# Patient Record
Sex: Male | Born: 1960 | Race: White | Hispanic: No | Marital: Married | Smoking: Never smoker
Health system: Southern US, Community
[De-identification: ages and names within clinical notes are randomized; demographics above are authoritative.]

## PROBLEM LIST (undated history)

## (undated) DIAGNOSIS — K5190 Ulcerative colitis, unspecified, without complications: Secondary | ICD-10-CM

## (undated) DIAGNOSIS — F32A Depression, unspecified: Secondary | ICD-10-CM

## (undated) DIAGNOSIS — E785 Hyperlipidemia, unspecified: Secondary | ICD-10-CM

## (undated) DIAGNOSIS — M1990 Unspecified osteoarthritis, unspecified site: Secondary | ICD-10-CM

## (undated) DIAGNOSIS — F329 Major depressive disorder, single episode, unspecified: Secondary | ICD-10-CM

## (undated) DIAGNOSIS — F1010 Alcohol abuse, uncomplicated: Secondary | ICD-10-CM

## (undated) DIAGNOSIS — M87052 Idiopathic aseptic necrosis of left femur: Secondary | ICD-10-CM

## (undated) DIAGNOSIS — K219 Gastro-esophageal reflux disease without esophagitis: Secondary | ICD-10-CM

## (undated) HISTORY — DX: Major depressive disorder, single episode, unspecified: F32.9

## (undated) HISTORY — DX: Hyperlipidemia, unspecified: E78.5

## (undated) HISTORY — PX: WISDOM TOOTH EXTRACTION: SHX21

## (undated) HISTORY — DX: Alcohol abuse, uncomplicated: F10.10

## (undated) HISTORY — PX: COLONOSCOPY: SHX174

## (undated) HISTORY — DX: Gastro-esophageal reflux disease without esophagitis: K21.9

## (undated) HISTORY — DX: Depression, unspecified: F32.A

---

## 2002-02-17 HISTORY — PX: HERNIA REPAIR: SHX51

## 2007-02-27 ENCOUNTER — Emergency Department (HOSPITAL_COMMUNITY): Admission: EM | Admit: 2007-02-27 | Discharge: 2007-02-27 | Payer: Self-pay | Admitting: Emergency Medicine

## 2009-03-28 LAB — HM COLONOSCOPY

## 2013-02-25 ENCOUNTER — Other Ambulatory Visit (HOSPITAL_COMMUNITY)
Admission: RE | Admit: 2013-02-25 | Discharge: 2013-02-25 | Disposition: A | Discharge status: Home / Self Care | Payer: BC Managed Care – PPO | Source: Ambulatory Visit | Attending: Gastroenterology | Admitting: Gastroenterology

## 2013-02-25 DIAGNOSIS — Z0389 Encounter for observation for other suspected diseases and conditions ruled out: Secondary | ICD-10-CM | POA: Insufficient documentation

## 2015-11-05 DIAGNOSIS — F329 Major depressive disorder, single episode, unspecified: Secondary | ICD-10-CM | POA: Insufficient documentation

## 2015-11-05 DIAGNOSIS — F32A Depression, unspecified: Secondary | ICD-10-CM | POA: Insufficient documentation

## 2015-11-05 DIAGNOSIS — F419 Anxiety disorder, unspecified: Secondary | ICD-10-CM | POA: Insufficient documentation

## 2016-06-02 DIAGNOSIS — M25552 Pain in left hip: Secondary | ICD-10-CM | POA: Insufficient documentation

## 2016-07-18 ENCOUNTER — Other Ambulatory Visit: Payer: Self-pay | Admitting: Orthopedic Surgery

## 2016-07-18 DIAGNOSIS — M25552 Pain in left hip: Secondary | ICD-10-CM

## 2016-07-30 ENCOUNTER — Ambulatory Visit
Admission: RE | Admit: 2016-07-30 | Discharge: 2016-07-30 | Disposition: A | Discharge status: Home / Self Care | Payer: BLUE CROSS/BLUE SHIELD | Source: Ambulatory Visit | Attending: Orthopedic Surgery | Admitting: Orthopedic Surgery

## 2016-07-30 DIAGNOSIS — M25552 Pain in left hip: Secondary | ICD-10-CM

## 2016-07-30 MED ORDER — IOPAMIDOL (ISOVUE-M 200) INJECTION 41%
15.0000 mL | Freq: Once | INTRAMUSCULAR | Status: AC
  Administered 2016-07-30: 15 mL via INTRA_ARTICULAR

## 2016-09-09 ENCOUNTER — Encounter (INDEPENDENT_AMBULATORY_CARE_PROVIDER_SITE_OTHER): Payer: Self-pay | Admitting: Orthopaedic Surgery

## 2016-09-09 ENCOUNTER — Ambulatory Visit (INDEPENDENT_AMBULATORY_CARE_PROVIDER_SITE_OTHER): Payer: BLUE CROSS/BLUE SHIELD | Admitting: Orthopaedic Surgery

## 2016-09-09 VITALS — BP 136/95 | HR 63 | Ht 67.0 in | Wt 180.0 lb

## 2016-09-09 DIAGNOSIS — M87051 Idiopathic aseptic necrosis of right femur: Secondary | ICD-10-CM | POA: Diagnosis not present

## 2016-09-09 DIAGNOSIS — M87052 Idiopathic aseptic necrosis of left femur: Secondary | ICD-10-CM

## 2016-09-22 DIAGNOSIS — M87052 Idiopathic aseptic necrosis of left femur: Principal | ICD-10-CM

## 2016-09-22 DIAGNOSIS — M87051 Idiopathic aseptic necrosis of right femur: Secondary | ICD-10-CM | POA: Insufficient documentation

## 2016-09-22 NOTE — Progress Notes (Signed)
Office Visit Note   Patient: Brett Fowler           Date of Birth: [birth year]           MRN: [id number] Visit Date: 09/09/2016              Requested by: Aleen Campi, NP [address] [place], [place] [address] PCP: Aleen Campi, NP   Assessment & Plan: Visit Diagnoses:  1. Avascular necrosis of bones of both hips ([place])     Plan: Patient has hip AVN significant involvement was secondary hip osteoarthritis. We discussed treatment options and treatment would require left total hip arthroplasty. We discussed direct anterior approach 2 night stay in the hospital use of crutches or walker after the surgery. He understands that he may later require hip arthroplasty on the opposite right hip or he might revascularize his right hip without significant collapse and not require total hip arthroplasty on the right side. We discussed the pathophysiology of the condition and given a copy of the report. We reviewed the MRI images as well as plain radiographs. He can call if he would like to schedule left total hip arthroplasty. Otherwise office follow-up in 4 months  Follow-Up Instructions: Return in about 4 months (around 01/10/2017).   Orders:  No orders of the defined types were placed in this encounter.  No orders of the defined types were placed in this encounter.     Procedures: No procedures performed   Clinical Data: No additional findings.   Subjective: Chief Complaint  Patient presents with  . Left Hip - Pain    HPI 56 year old male here with left greater than right hip pain. He was seen by his primary care physician and x-rays obtained was cemented [facility]. He had an injection in his hip the left leg gave way from just a few days pain when he first gets up. Pain when he flexes his hip past 90 he's taken ibuprofen intermittently and also is on menthol patches and Lexapro. He works as Wellsite geologist. Married his wife Butch Penny. Previous history of hernia  surgeries not been on any high-dose steroids. He denies fever chills.  Review of Systems positive for previous hernia surgery 2004, acid reflux history of bronchitis also depression. Objective: Vital Signs: BP (!) 136/95   Pulse 63   Ht 5' 7"  (1.702 m)   Wt 180 lb (81.6 kg)   BMI 28.19 kg/m   Physical Exam  Constitutional: He is oriented to person, place, and time. He appears well-developed and well-nourished.  HENT:  Head: Normocephalic and atraumatic.  Eyes: Pupils are equal, round, and reactive to light. EOM are normal.  Neck: No tracheal deviation present. No thyromegaly present.  Cardiovascular: Normal rate.   Pulmonary/Chest: Effort normal. He has no wheezes.  Abdominal: Soft. Bowel sounds are normal.  Musculoskeletal:  Patient ambulates with left greater than right Trendelenburg gait. No hip flexion contracture pain with flexion past 100. Patient has 20 internal rotation left hip with pain. No firm endpoint. External rotation 40 also with pain. 60 internal and external rotation opposite right hip with mild pain. Distal pulses are 2+ no rash or exposed skin negative popped of compression test. Patient has pain with straight leg raising which is in the groin related to his hip joint no setting not tenderness pelvis is leveldoses.  Neurological: He is alert and oriented to person, place, and time.  Skin: Skin is warm and dry. Capillary refill takes less  than 2 seconds.  Psychiatric: He has a normal mood and affect. His behavior is normal. Judgment and thought content normal.    Ortho Exam  Specialty Comments:  No specialty comments available.  Imaging: Study Result   CLINICAL DATA:  Left hip pain radiating to left knee x3 months without known injury. No surgery. Little relief with steroid injection.  EXAM: MRI OF THE LEFT HIP WITH CONTRAST  TECHNIQUE: Multiplanar, multisequence MR imaging was performed following the administration of intra-articular  contrast.  CONTRAST:  Please refer to the procedural report for contrast type and volume used.  COMPARISON:  None.  FINDINGS: Bones: Abnormal marrow edema involving the left femoral neck and base of femoral head associated with a 3.1 x 3 cm area of osteonecrosis involving the anterior weight-bearing portion of the femoral head. No collapse of the femoral head is identified. A similar abnormality involving the subchondral right femoral head is also noted measuring 2.1 cm transverse. Associated joint space narrowing of both hips with subchondral degenerative cystic change of both acetabular roofs.  Articular cartilage and labrum  Articular cartilage: Irregular thinning of the left femoral head and acetabular cartilage without focal chondral defect.  Labrum: Tear of the left superior and posterior acetabular labrum, series 2, image 9 through 14 and series 3 image 7 through 10.  Joint or bursal effusion  Joint effusion: Contrast distention of the left hip joint without intra-articular loose bodies.  Bursae:  No bursal fluid collections.  Muscles and tendons  Muscles and tendons:  Negative  Other findings  Miscellaneous: Included lumbar spine from L3 caudad demonstrates no significant disc desiccation. The sacroiliac joints maintained. No bone marrow abnormality of the sacrum or sacral ala.  IMPRESSION: 1. Marrow edema of the left femoral neck and base of femoral head associated with 3 cm zone of osteonecrosis. No flattening of the left femoral head. Similar subchondral zone of marrow signal abnormality involving the weight-bearing portion of the right femoral head without collapse. 2. Osteoarthritic joint space narrowing with chondral thinning of both hips with sub chondral cystic change also noted of the acetabular roofs. 3. Left acetabular labral tear involving the superior and posterior labrum.   Electronically Signed   By: Ashley Royalty M.D.    On: 07/30/2016       PMFS History: There are no active problems to display for this patient.  Past Medical History:  Diagnosis Date  . Acid reflux   . Depression     No family history on file.  Past Surgical History:  Procedure Laterality Date  . HERNIA REPAIR  2004   Social History   Occupational History  . shipping clerk    Social History Main Topics  . Smoking status: Never Smoker  . Smokeless tobacco: Never Used  . Alcohol use Yes     Comment: weekly  . Drug use: No  . Sexual activity: Not on file

## 2017-01-13 ENCOUNTER — Ambulatory Visit (INDEPENDENT_AMBULATORY_CARE_PROVIDER_SITE_OTHER): Payer: BLUE CROSS/BLUE SHIELD | Admitting: Orthopaedic Surgery

## 2017-01-20 ENCOUNTER — Encounter (INDEPENDENT_AMBULATORY_CARE_PROVIDER_SITE_OTHER): Payer: Self-pay | Admitting: Orthopaedic Surgery

## 2017-01-20 ENCOUNTER — Ambulatory Visit (INDEPENDENT_AMBULATORY_CARE_PROVIDER_SITE_OTHER): Payer: BLUE CROSS/BLUE SHIELD | Admitting: Orthopaedic Surgery

## 2017-01-20 VITALS — BP 137/84 | HR 55 | Ht 67.0 in | Wt 180.0 lb

## 2017-01-20 DIAGNOSIS — M1612 Unilateral primary osteoarthritis, left hip: Secondary | ICD-10-CM

## 2017-01-20 DIAGNOSIS — M87052 Idiopathic aseptic necrosis of left femur: Secondary | ICD-10-CM | POA: Diagnosis not present

## 2017-01-20 DIAGNOSIS — M87051 Idiopathic aseptic necrosis of right femur: Secondary | ICD-10-CM | POA: Diagnosis not present

## 2017-01-20 NOTE — Progress Notes (Signed)
Office Visit Note   Patient: Brett Fowler           Date of Birth: 08-Mar-1960           MRN: [id number] Visit Date: 01/20/2017              Requested by: Aleen Campi, NP [place], [place] [address] PCP: Aleen Campi, NP   Assessment & Plan: Visit Diagnoses:  1. Avascular necrosis of bones of both hips ([place])   2. Arthritis of left hip     Plan: We will proceed with left total hip arthroplasty after the holidays.  We discussed operative technique decision for surgery was made today wrist surgery discussed.  He may be an overnight stay patient since he has been active and working in opposite hip is not giving him any pain.  Procedure discussed he understands and requests we proceed.  Follow-Up Instructions: No Follow-up on file.   Orders:  No orders of the defined types were placed in this encounter.  No orders of the defined types were placed in this encounter.     Procedures: No procedures performed   Clinical Data: No additional findings.   Subjective: Chief Complaint  Patient presents with  . Right Hip - Pain, Follow-up  . Left Hip - Pain, Follow-up    HPI 56 year old male returns with ongoing pain with bilateral AVN with left hip pain and Trendelenburg gait.  He has pain with activities of daily living pain that bothers him at night and has had gradual progression of his hip pain with greater than 3 cm avascular area of the femoral head.  He has secondary osteoarthritis documented on MRI scan and has opposite right hip AVN but is not symptomatic.  Patient had previous cortisone injection in his hip use of a cane and several anti-inflammatories without control of his hip pain.  Pain at night and pain with activities of daily living.  He has an autistic son who lives with them age 60 and patient states she wants to put off hip surgery until after the holidays so he can arrange someone else to help his wife take care of his son with special  needs.  Review of Systems for history of bronchitis reflux, depression previous hernia surgery and AVN of the hips.   Objective: Vital Signs: BP 137/84   Pulse (!) 55   Ht 5' 7"  (1.702 m)   Wt 180 lb (81.6 kg)   BMI 28.19 kg/m   Physical Exam  Constitutional: He is oriented to person, place, and time. He appears well-developed and well-nourished.  HENT:  Head: Normocephalic and atraumatic.  Eyes: EOM are normal. Pupils are equal, round, and reactive to light.  Neck: No tracheal deviation present. No thyromegaly present.  Cardiovascular: Normal rate.  Pulmonary/Chest: Effort normal. He has no wheezes.  Abdominal: Soft. Bowel sounds are normal.  Neurological: He is alert and oriented to person, place, and time.  Skin: Skin is warm and dry. Capillary refill takes less than 2 seconds.  Psychiatric: He has a normal mood and affect. His behavior is normal. Judgment and thought content normal.    Ortho Exam positive Trendelenburg gait.  He has severe pain with left internal rotation of his hip and positive logroll test.  Negative Trendelenburg in the right hip distal pulses are 2+.  No sciatic notch tenderness.  Specialty Comments:  No specialty comments available.  Imaging: Previous MRI scan showed marrow edema 3 cm involvement  osteonecrosis with labral tearing anterior and posteriorly chondral thinning osteoarthritis and subchondral cyst formation in the acetabular roof.   PMFS History: Patient Active Problem List   Diagnosis Date Noted  . Avascular necrosis of bones of both hips ([place]) 09/22/2016   Past Medical History:  Diagnosis Date  . Acid reflux   . Depression     No family history on file.  Past Surgical History:  Procedure Laterality Date  . HERNIA REPAIR  2004   Social History   Occupational History  . Occupation: Wellsite geologist  Tobacco Use  . Smoking status: Never Smoker  . Smokeless tobacco: Never Used  Substance and Sexual Activity  . Alcohol use: Yes     Comment: weekly  . Drug use: No  . Sexual activity: Not on file

## 2017-06-03 LAB — HM COLONOSCOPY

## 2017-09-26 ENCOUNTER — Other Ambulatory Visit: Payer: Self-pay

## 2017-09-26 ENCOUNTER — Encounter (HOSPITAL_COMMUNITY): Payer: Self-pay

## 2017-09-26 ENCOUNTER — Emergency Department (HOSPITAL_COMMUNITY): Payer: BLUE CROSS/BLUE SHIELD

## 2017-09-26 ENCOUNTER — Emergency Department (HOSPITAL_COMMUNITY)
Admission: EM | Admit: 2017-09-26 | Discharge: 2017-09-26 | Disposition: A | Discharge status: Home / Self Care | Payer: BLUE CROSS/BLUE SHIELD | Attending: Emergency Medicine | Admitting: Emergency Medicine

## 2017-09-26 DIAGNOSIS — R319 Hematuria, unspecified: Secondary | ICD-10-CM | POA: Insufficient documentation

## 2017-09-26 DIAGNOSIS — M791 Myalgia, unspecified site: Secondary | ICD-10-CM | POA: Insufficient documentation

## 2017-09-26 DIAGNOSIS — R5381 Other malaise: Secondary | ICD-10-CM | POA: Insufficient documentation

## 2017-09-26 DIAGNOSIS — R630 Anorexia: Secondary | ICD-10-CM | POA: Insufficient documentation

## 2017-09-26 DIAGNOSIS — R531 Weakness: Secondary | ICD-10-CM | POA: Insufficient documentation

## 2017-09-26 DIAGNOSIS — L989 Disorder of the skin and subcutaneous tissue, unspecified: Secondary | ICD-10-CM | POA: Diagnosis not present

## 2017-09-26 DIAGNOSIS — Z79899 Other long term (current) drug therapy: Secondary | ICD-10-CM | POA: Diagnosis not present

## 2017-09-26 DIAGNOSIS — R6883 Chills (without fever): Secondary | ICD-10-CM | POA: Diagnosis not present

## 2017-09-26 DIAGNOSIS — R5383 Other fatigue: Secondary | ICD-10-CM | POA: Insufficient documentation

## 2017-09-26 DIAGNOSIS — R21 Rash and other nonspecific skin eruption: Secondary | ICD-10-CM | POA: Diagnosis not present

## 2017-09-26 DIAGNOSIS — R51 Headache: Secondary | ICD-10-CM | POA: Diagnosis not present

## 2017-09-26 HISTORY — DX: Idiopathic aseptic necrosis of left femur: M87.052

## 2017-09-26 LAB — CBC WITH DIFFERENTIAL/PLATELET
ABS IMMATURE GRANULOCYTES: 0 10*3/uL (ref 0.0–0.1)
BASOS ABS: 0 10*3/uL (ref 0.0–0.1)
Basophils Relative: 1 %
Eosinophils Absolute: 0 10*3/uL (ref 0.0–0.7)
Eosinophils Relative: 0 %
HCT: 42.1 % (ref 39.0–52.0)
HEMOGLOBIN: 14.2 g/dL (ref 13.0–17.0)
Immature Granulocytes: 1 %
LYMPHS PCT: 9 %
Lymphs Abs: 0.4 10*3/uL — ABNORMAL LOW (ref 0.7–4.0)
MCH: 35.2 pg — AB (ref 26.0–34.0)
MCHC: 33.7 g/dL (ref 30.0–36.0)
MCV: 104.5 fL — ABNORMAL HIGH (ref 78.0–100.0)
MONO ABS: 0.3 10*3/uL (ref 0.1–1.0)
Monocytes Relative: 6 %
NEUTROS ABS: 3.4 10*3/uL (ref 1.7–7.7)
NEUTROS PCT: 83 %
PLATELETS: 129 10*3/uL — AB (ref 150–400)
RBC: 4.03 MIL/uL — ABNORMAL LOW (ref 4.22–5.81)
RDW: 12.6 % (ref 11.5–15.5)
WBC: 4 10*3/uL (ref 4.0–10.5)

## 2017-09-26 LAB — COMPREHENSIVE METABOLIC PANEL
ALBUMIN: 3.5 g/dL (ref 3.5–5.0)
ALT: 42 U/L (ref 0–44)
ANION GAP: 8 (ref 5–15)
AST: 45 U/L — ABNORMAL HIGH (ref 15–41)
Alkaline Phosphatase: 45 U/L (ref 38–126)
BUN: 10 mg/dL (ref 6–20)
CO2: 26 mmol/L (ref 22–32)
Calcium: 8.8 mg/dL — ABNORMAL LOW (ref 8.9–10.3)
Chloride: 102 mmol/L (ref 98–111)
Creatinine, Ser: 0.84 mg/dL (ref 0.61–1.24)
GFR calc non Af Amer: >60 mL/min (ref >60)
GLUCOSE: 130 mg/dL — AB (ref 70–99)
POTASSIUM: 4 mmol/L (ref 3.5–5.1)
SODIUM: 136 mmol/L (ref 135–145)
TOTAL PROTEIN: 6.9 g/dL (ref 6.5–8.1)
Total Bilirubin: 0.8 mg/dL (ref 0.3–1.2)

## 2017-09-26 LAB — URINALYSIS, ROUTINE W REFLEX MICROSCOPIC
Bacteria, UA: NONE SEEN
Bilirubin Urine: NEGATIVE
GLUCOSE, UA: 150 mg/dL — AB
KETONES UR: NEGATIVE mg/dL
Leukocytes, UA: NEGATIVE
NITRITE: NEGATIVE
PH: 6 (ref 5.0–8.0)
PROTEIN: 100 mg/dL — AB
Specific Gravity, Urine: 1.027 (ref 1.005–1.030)

## 2017-09-26 LAB — I-STAT CG4 LACTIC ACID, ED: Lactic Acid, Venous: 1.43 mmol/L (ref 0.5–1.9)

## 2017-09-26 MED ORDER — DOXYCYCLINE HYCLATE 100 MG PO TABS
100.0000 mg | ORAL_TABLET | Freq: Once | ORAL | Status: AC
  Administered 2017-09-26: 100 mg via ORAL
  Filled 2017-09-26: qty 1

## 2017-09-26 MED ORDER — KETOROLAC TROMETHAMINE 30 MG/ML IJ SOLN
30.0000 mg | Freq: Once | INTRAMUSCULAR | Status: AC
  Administered 2017-09-26: 30 mg via INTRAVENOUS
  Filled 2017-09-26: qty 1

## 2017-09-26 MED ORDER — DOXYCYCLINE HYCLATE 100 MG PO CAPS: 100.0000 mg | ORAL_CAPSULE | Freq: Two times a day (BID) | ORAL | 0 refills | Status: DC

## 2017-09-26 NOTE — Discharge Instructions (Addendum)
Take doxycyline as prescribed until all gone if lymes titres come back positive. If negative, take for a week. If no improvement in symptoms, please follow up with your doctor or return here.

## 2017-09-26 NOTE — ED Provider Notes (Signed)
[facility] EMERGENCY DEPARTMENT Provider Note   CSN: [id number] Arrival date & time: 09/26/17  1208     History   Chief Complaint Chief Complaint  Patient presents with  . Rash  . Fatigue    HPI Brett Fowler is a 57 y.o. male.  HPI Brett Fowler is a 57 y.o. male to emergency department complaining of chills, fatigue, headache, generalized malaise, rash.  Patient states his symptoms mainly started 4 days ago.  He has been to his primary care doctor 2 days ago and had basic labs, thyroid function, urine analysis checked.  The only abnormal finding apparently according to the notes is that he had some blood in his urine.  Patient's symptoms are just not improving.  He did not check his temperature at home but states he has had sweats and chills.  He has had decreased appetite and generalized fatigue and malaise.  He has been taking Tylenol Motrin for his body aches and possible fever with no relief of his symptoms.  Today he woke up with a rash to his left thigh.  Denies any definite tick bites but states he is outdoors almost daily.  He denies any upper respiratory symptoms.  No cough or congestion.  No urinary symptoms.  No nausea or vomiting, no diarrhea no chest pain or abdominal pain.  The rash on his leg is nontender and not itchy.  Past Medical History:  Diagnosis Date  . Acid reflux   . Avascular necrosis of bone of hip, left ([place])   . Depression     Patient Active Problem List   Diagnosis Date Noted  . Avascular necrosis of bones of both hips ([place]) 09/22/2016    Past Surgical History:  Procedure Laterality Date  . HERNIA REPAIR  2004        Home Medications    Prior to Admission medications   Medication Sig Start Date End Date Taking? Authorizing Provider  escitalopram (LEXAPRO) 20 MG tablet TAKE 1 TABLET BY MOUTH EVERY DAY 08/12/16   [provider]  mercaptopurine (PURINETHOL) 50 MG tablet  08/12/16   [provider]    mesalamine (LIALDA) 1.2 g EC tablet  08/12/16   [provider]    Family History History reviewed. No pertinent family history.  Social History Social History   Tobacco Use  . Smoking status: Never Smoker  . Smokeless tobacco: Never Used  Substance Use Topics  . Alcohol use: Yes    Comment: weekly  . Drug use: No     Allergies   Patient has no known allergies.   Review of Systems Review of Systems  Constitutional: Positive for activity change, appetite change, chills and fatigue.  Respiratory: Negative for cough, chest tightness and shortness of breath.   Cardiovascular: Negative for chest pain, palpitations and leg swelling.  Gastrointestinal: Negative for abdominal distention, abdominal pain, diarrhea, nausea and vomiting.  Genitourinary: Negative for dysuria, frequency, hematuria and urgency.  Musculoskeletal: Positive for arthralgias and myalgias. Negative for neck pain and neck stiffness.  Skin: Positive for rash.  Allergic/Immunologic: Negative for immunocompromised state.  Neurological: Positive for weakness and headaches. Negative for dizziness, light-headedness and numbness.  All other systems reviewed and are negative.    Physical Exam Updated Vital Signs BP 108/81 (BP Location: Right Arm)   Pulse 79   Temp 97.9 F (36.6 C) (Oral)   Resp 17   SpO2 100%   Physical Exam  Constitutional: He appears well-developed and well-nourished.  No distress.  HENT:  Head: Normocephalic and atraumatic.  Mouth/Throat: Oropharynx is clear and moist.  Eyes: Conjunctivae are normal.  Neck: Neck supple.  No meningismus  Cardiovascular: Normal rate, regular rhythm and normal heart sounds.  Pulmonary/Chest: Effort normal. No respiratory distress. He has no wheezes. He has no rales.  Abdominal: Soft. Bowel sounds are normal. He exhibits no distension. There is no tenderness. There is no rebound.  Musculoskeletal: He exhibits no edema.  Neurological: He is alert.   Skin: Skin is warm and dry.  Target lesion to the left medial thigh, non tender, not indurated, not raised  Nursing note and vitals reviewed.      ED Treatments / Results  Labs (all labs ordered are listed, but only abnormal results are displayed) Labs Reviewed  COMPREHENSIVE METABOLIC PANEL - Abnormal; Notable for the following components:      Result Value   Glucose, Bld 130 (*)    Calcium 8.8 (*)    AST 45 (*)    All other components within normal limits  CBC WITH DIFFERENTIAL/PLATELET - Abnormal; Notable for the following components:   RBC 4.03 (*)    MCV 104.5 (*)    MCH 35.2 (*)    Platelets 129 (*)    Lymphs Abs 0.4 (*)    All other components within normal limits  URINALYSIS, ROUTINE W REFLEX MICROSCOPIC  LYME DISEASE DNA BY PCR([facility]IA [facility]UR[facility])  ROCKY MTN SPOTTED FVR ABS PNL(IGG+IGM)  I-STAT CG4 LACTIC ACID, ED    EKG None  Radiology No results found.  Procedures Procedures (including critical care time)  Medications Ordered in ED Medications  ketorolac (TORADOL) 30 MG/ML injection 30 mg (has no administration in time range)     Initial Impression / Assessment and Plan / ED Course  I have reviewed the triage vital signs and the nursing notes.  Pertinent labs & imaging results that were available during my care of the patient were reviewed by me and considered in my medical decision making (see chart for details).     Pt in ED with 4 days of generalized weakness, fatigue, malaise, headache. Today woke up with target lesion to left thigh consistent with erythema migrans. Will recheck labs and add tick born illness labs. VS normal.   2:50 PM Labs show just barely elevated AST and glucose, also hematuria otherwise unremarkable. His symptoms and rash concerning for possible lymes disease. PCR test sent. Will start on doxycyline. Otherwise pt is non toxic. Eating here in department with no difficulty. Will dc home with doxycycline and follow up with pcp as  well as follow up on lymes and RMSF. We discussed return precautions.   Vitals:   09/26/17 1400 09/26/17 1430  BP: 118/82 117/74  Pulse: 71 78  Resp: 16 16  Temp:    SpO2: 98% 98%     Final Clinical Impressions(s) / ED Diagnoses   Final diagnoses:  Weakness  Rash    ED Discharge Orders         Ordered    doxycycline (VIBRAMYCIN) 100 MG capsule  2 times daily     09/26/17 1516           Jeannett Senior, PA-C 09/26/17 1520    Lajean Saver, MD 09/26/17 1533

## 2017-09-26 NOTE — ED Triage Notes (Signed)
Onset 09-22-17 malaise, chills, aches, headache, decreased appetite, and diaphoresis.  Pt was seen at PCP 09-24-17, dx: viral illness.  Symptoms are not improving.  Onset this morning pt noticed a reddened area on left upper medial thigh, 3.5cm x 8cm.  Pt has not noticed this until today,

## 2017-09-26 NOTE — ED Notes (Signed)
Patient verbalizes understanding of discharge instructions. Opportunity for questioning and answers were provided. Armband removed by staff, pt discharged from ED.  

## 2017-09-29 LAB — LYME DISEASE DNA BY PCR(BORRELIA BURG): Lyme Disease(B.burgdorferi)PCR: NEGATIVE

## 2017-09-29 LAB — ROCKY MTN SPOTTED FVR ABS PNL(IGG+IGM)
RMSF IGG: NEGATIVE
RMSF IGM: 0.96 {index} — AB (ref 0.00–0.89)

## 2017-09-30 LAB — LYME, WESTERN BLOT, SERUM (REFLEXED)
IGG P18 AB.: ABSENT
IGG P28 AB.: ABSENT
IGG P30 AB.: ABSENT
IGG P39 AB.: ABSENT
IGG P41 AB.: ABSENT
IGG P45 AB.: ABSENT
IGG P93 AB.: ABSENT
IgG P23 Ab.: ABSENT
IgG P58 Ab.: ABSENT
IgG P66 Ab.: ABSENT
IgM P39 Ab.: ABSENT
IgM P41 Ab.: ABSENT
Lyme IgG Wb: NEGATIVE
Lyme IgM Wb: NEGATIVE

## 2017-09-30 LAB — B. BURGDORFI ANTIBODIES: B BURGDORFERI AB IGG+ IGM: 1.09 {ISR} — AB (ref 0.00–0.90)

## 2017-11-03 ENCOUNTER — Ambulatory Visit (INDEPENDENT_AMBULATORY_CARE_PROVIDER_SITE_OTHER): Payer: Self-pay

## 2017-11-03 ENCOUNTER — Ambulatory Visit (INDEPENDENT_AMBULATORY_CARE_PROVIDER_SITE_OTHER): Payer: BLUE CROSS/BLUE SHIELD | Admitting: Orthopaedic Surgery

## 2017-11-03 VITALS — BP 124/84 | HR 58 | Ht 67.0 in | Wt 180.0 lb

## 2017-11-03 DIAGNOSIS — M87052 Idiopathic aseptic necrosis of left femur: Secondary | ICD-10-CM | POA: Diagnosis not present

## 2017-11-03 DIAGNOSIS — M25552 Pain in left hip: Secondary | ICD-10-CM

## 2017-11-03 NOTE — Progress Notes (Signed)
Office Visit Note   Patient: Brett Fowler           Date of Birth: [birth year]           MRN: [id number] Visit Date: 11/03/2017              Requested by: Aleen Campi, NP [address] [place], [place] [address] PCP: Aleen Campi, NP   Assessment & Plan: Visit Diagnoses:  1. Pain in left hip   2. Avascular necrosis of hip, left ([facility])     Plan: Long-standing bilateral hip AVN with now left hip collapse.  He has no joint space has a 3 x 3 cm mobile fragment of the avascular piece that collapsed with secondary degenerative acetabular changes.  He is having great difficulty working anti-inflammatories are not effective .  He is having trouble working and taking care of his family.  Patient will require total hip arthroplasty.  Direct anterior approach discussed.  Postoperative rehab discussed risks of surgery discussed spinal anesthesia 1-2 night stay in the hospital use of a walker and then crutches postoperatively.  Questions were elicited and answered.  He understands only proceed with surgical intervention.  Follow-Up Instructions: No follow-ups on file.   Orders:  Orders Placed This Encounter  Procedures  . XR HIP UNILAT W OR W/O PELVIS 1V LEFT   No orders of the defined types were placed in this encounter.     Procedures: No procedures performed   Clinical Data: No additional findings.   Subjective: Chief Complaint  Patient presents with  . Left Hip - Pain    HPI 57 year old male returns with bilateral hip AVN with severe left hip pain.  He is amatory with a limp and have trouble doing his job has had to use a cane and is taken anti-inflammatories without relief and has problems by the end of the day with walking.  Previous MRI 07/30/2016 showed bilateral head AVN with left hip involvement of a 3.1 x 3 cm segment that had not collapsed last year.  X-rays obtained today shows severe collapse secondary hip osteoarthritis.  Opposite right hip shows no changes from  last year and he denies any right hip discomfort.  He has an autistic son who was large and muscular and sometimes combative and is having great difficulty taking care of his son who has special needs.   Review of Systems 14 point review of systems positive history of bronchitis, GI reflux, depression, previous hernia surgery.  Lateral hip AVN times several years.   Objective: Vital Signs: BP 124/84   Pulse (!) 58   Ht 5' 7"  (1.702 m)   Wt 180 lb (81.6 kg)   BMI 28.19 kg/m   Physical Exam  Constitutional: He is oriented to person, place, and time. He appears well-developed and well-nourished.  HENT:  Head: Normocephalic and atraumatic.  Eyes: Pupils are equal, round, and reactive to light. EOM are normal.  Neck: No tracheal deviation present. No thyromegaly present.  Cardiovascular: Normal rate.  Pulmonary/Chest: Effort normal. He has no wheezes.  Abdominal: Soft. Bowel sounds are normal.  Neurological: He is alert and oriented to person, place, and time.  Skin: Skin is warm and dry. Capillary refill takes less than 2 seconds.  Psychiatric: He has a normal mood and affect. His behavior is normal. Judgment and thought content normal.    Ortho Exam patient has intact pulses severe Trendelenburg gait.  5 degrees internal rotation left hip which reproduces pain.  Right  hip  Has  45 degrees internal/external rotation without discomfort.  External rotation of left hip is limited to only 20 degrees with severe pain.  Tailgate gait intact distal pulses negative straight leg raising no trochanteric bursal tenderness.  No synovitis of the upper extremities.  Sensation is foot is intact no plantar foot lesions.  Specialty Comments:  No specialty comments available.  Imaging: No results found.   PMFS History: Patient Active Problem List   Diagnosis Date Noted  . Avascular necrosis of bones of both hips ([place]) 09/22/2016   Past Medical History:  Diagnosis Date  . Acid reflux   .  Avascular necrosis of bone of hip, left ([place])   . Depression     No family history on file.  Past Surgical History:  Procedure Laterality Date  . HERNIA REPAIR  2004   Social History   Occupational History  . Occupation: Wellsite geologist  Tobacco Use  . Smoking status: Never Smoker  . Smokeless tobacco: Never Used  Substance and Sexual Activity  . Alcohol use: Yes    Comment: weekly  . Drug use: No  . Sexual activity: Not on file

## 2017-11-05 ENCOUNTER — Encounter (INDEPENDENT_AMBULATORY_CARE_PROVIDER_SITE_OTHER): Payer: Self-pay | Admitting: Orthopaedic Surgery

## 2018-02-24 ENCOUNTER — Ambulatory Visit (INDEPENDENT_AMBULATORY_CARE_PROVIDER_SITE_OTHER): Payer: BLUE CROSS/BLUE SHIELD | Admitting: Surgery

## 2018-03-24 ENCOUNTER — Inpatient Hospital Stay (INDEPENDENT_AMBULATORY_CARE_PROVIDER_SITE_OTHER): Payer: BLUE CROSS/BLUE SHIELD | Admitting: Orthopaedic Surgery

## 2018-04-14 ENCOUNTER — Encounter (INDEPENDENT_AMBULATORY_CARE_PROVIDER_SITE_OTHER): Payer: Self-pay | Admitting: Family

## 2018-04-14 ENCOUNTER — Ambulatory Visit (INDEPENDENT_AMBULATORY_CARE_PROVIDER_SITE_OTHER): Payer: BLUE CROSS/BLUE SHIELD | Admitting: Family

## 2018-04-14 ENCOUNTER — Ambulatory Visit (INDEPENDENT_AMBULATORY_CARE_PROVIDER_SITE_OTHER): Payer: BLUE CROSS/BLUE SHIELD | Admitting: Surgery

## 2018-04-14 VITALS — BP 135/85 | HR 57 | Temp 98.3°F | Ht 67.0 in | Wt 180.0 lb

## 2018-04-14 DIAGNOSIS — Z01818 Encounter for other preprocedural examination: Secondary | ICD-10-CM

## 2018-04-14 DIAGNOSIS — M87052 Idiopathic aseptic necrosis of left femur: Secondary | ICD-10-CM | POA: Diagnosis not present

## 2018-04-14 DIAGNOSIS — M87051 Idiopathic aseptic necrosis of right femur: Secondary | ICD-10-CM

## 2018-04-14 NOTE — H&P (Signed)
TOTAL HIP ADMISSION H&P  Patient is admitted for left total hip arthroplasty.  Subjective:  Chief Complaint: left hip pain  HPI: Brett Fowler, 58 y.o. male, has a history of pain and functional disability in the left hip(s) due to arthritis and AVN and patient has failed non-surgical conservative treatments for greater than 12 weeks to include NSAID's and/or analgesics, corticosteriod injections and use of assistive devices.  Onset of symptoms was abrupt starting 2 years ago with gradually worsening course since that time.The patient noted no past surgery on the left hip(s).  Patient currently rates pain in the left hip at 6 out of 10 with activity. Patient has worsening of pain with activity and weight bearing, pain that interfers with activities of daily living and pain with passive range of motion. 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  X-rays obtained shows severe collapse secondary hip osteoarthritis by imaging studies. This condition presents safety issues increasing the risk of falls. This patient has had avascular necrosis of the hip, acetabular fracture, hip dysplasia.  There is no current active infection.  Patient Active Problem List   Diagnosis Date Noted  . Avascular necrosis of bones of both hips ([place]) 09/22/2016   Past Medical History:  Diagnosis Date  . Acid reflux   . Avascular necrosis of bone of hip, left ([place])   . Depression     Past Surgical History:  Procedure Laterality Date  . HERNIA REPAIR  2004    No current facility-administered medications for this encounter.    Current Outpatient Medications  Medication Sig Dispense Refill Last Dose  . escitalopram (LEXAPRO) 20 MG tablet Take 20 mg by mouth every evening.    Taking  . Garlic 4166 MG CAPS Take 1,000 mg by mouth daily.    Taking  . GLUCOSAMINE-CHONDROIT-MSM-C-MN PO Take 1,500 mg by mouth 2 (two) times daily.     Marland Kitchen  ibuprofen (ADVIL,MOTRIN) 200 MG tablet Take 400 mg by mouth 2 (two) times daily as needed for fever or moderate pain.    Taking  . mercaptopurine (PURINETHOL) 50 MG tablet Take 50 mg by mouth daily.   10 Taking  . mesalamine (LIALDA) 1.2 g EC tablet Take 2.4 g by mouth daily with breakfast.   8 Taking  . Multiple Vitamin (MULTIVITAMIN) capsule Take 1 capsule by mouth daily.   Taking  . Turmeric 500 MG TABS Take 500 mg by mouth daily.     . vitamin C (ASCORBIC ACID) 500 MG tablet Take 500 mg by mouth daily.     Marland Kitchen doxycycline (VIBRAMYCIN) 100 MG capsule Take 1 capsule (100 mg total) by mouth 2 (two) times daily. (Patient not taking: Reported on 04/14/2018) 28 capsule 0 Not Taking   No Known Allergies  Social History   Tobacco Use  . Smoking status: Never Smoker  . Smokeless tobacco: Never Used  Substance Use Topics  . Alcohol use: Yes    Comment: weekly    No family history on file.   Review of Systems  Constitutional: Negative for chills and fever.  Musculoskeletal: Positive for joint pain. Negative for falls.  All other systems reviewed and are negative.   Objective:  Physical Exam  Vitals reviewed. Constitutional: He is oriented to person, place, and time. He appears well-developed and well-nourished.  HENT:  Head: Normocephalic and atraumatic.  Eyes: Pupils are equal, round, and reactive to light. Conjunctivae  and EOM are normal.  Neck: Neck supple.  Cardiovascular: Normal rate and regular rhythm.  Respiratory: Effort normal and breath sounds normal.  GI: Soft. There is no abdominal tenderness.  Musculoskeletal:        General: Tenderness present. No edema.  Neurological: He is alert and oriented to person, place, and time.  Skin: Skin is warm and dry.  Psychiatric: He has a normal mood and affect.    Vital signs in last 24 hours: Temp:  [98.3 F (36.8 C)] 98.3 F (36.8 C) (02/26 0945) Pulse Rate:  [57] 57 (02/26 0945) BP: (135)/(85) 135/85 (02/26 0945) Weight:   [81.6 kg] 81.6 kg (02/26 0945)  Labs:   Estimated body mass index is 28.19 kg/m as calculated from the following:   Height as of 04/14/18: 5' 7"  (1.702 m).   Weight as of 04/14/18: 81.6 kg.   Imaging Review Plain radiographs demonstrate severe degenerative joint disease of the left hip(s). The bone quality appears to be fair for age and reported activity level.      Assessment/Plan:  End stage arthritis, left hip(s)  The patient history, physical examination, clinical judgement of the provider and imaging studies are consistent with end stage degenerative joint disease of the left hip(s) and total hip arthroplasty is deemed medically necessary. The treatment options including medical management, injection therapy, arthroscopy and arthroplasty were discussed at length. The risks and benefits of total hip arthroplasty were presented and reviewed. The risks due to aseptic loosening, infection, stiffness, dislocation/subluxation,  thromboembolic complications and other imponderables were discussed.  The patient acknowledged the explanation, agreed to proceed with the plan and consent was signed. Patient is being admitted for inpatient treatment for surgery, pain control, PT, OT, prophylactic antibiotics, VTE prophylaxis, progressive ambulation and ADL's and discharge planning.The patient is planning to be discharged home with home health services   Anticipated LOS equal to or greater than 2 midnights due to - Age 37 and older with one or more of the following:  - Obesity  - Expected need for hospital services (PT, OT, Nursing) required for safe  discharge  - Anticipated need for postoperative skilled nursing care or inpatient rehab  - Active co-morbidities: None OR   - Unanticipated findings during/Post Surgery: None  - Patient is a high risk of re-admission due to: None

## 2018-04-14 NOTE — Progress Notes (Signed)
Visit for pre op history and physical. See chart for this.

## 2018-04-16 NOTE — Pre-Procedure Instructions (Signed)
Brett Fowler  04/16/2018    Your procedure is scheduled on Wednesday, April 28, 2018 at 12:30 PM.   Report to [facility] Entrance "A" Admitting Office at 10:30 AM.   Call this number if you have problems the morning of surgery: [phone]   Questions prior to day of surgery, please call [phone] between 8 & 4 PM.   Remember:  Do not eat or drink after midnight Tuesday, 04/27/18.  Take these medicines the morning of surgery with A SIP OF WATER: Mercaptopurine (Purinethol), Mesalamine (Lialda)  Stop Multivitamins, Herbal medications and NSAIDS (Ibuprofen, Aleve, etc) 7 days prior to surgery.    Do not wear jewelry.  Do not wear lotions, powders, cologne or deodorant.  Men may shave face and neck.  Do not bring valuables to the hospital.  [facility] is not responsible for any belongings or valuables.  Contacts, dentures or bridgework may not be worn into surgery.  Leave your suitcase in the car.  After surgery it may be brought to your room.  For patients admitted to the hospital, discharge time will be determined by your treatment team.  [facility] - Preparing for Surgery  Before surgery, you can play an important role.  Because skin is not sterile, your skin needs to be as free of germs as possible.  You can reduce the number of germs on you skin by washing with CHG (chlorahexidine gluconate) soap before surgery.  CHG is an antiseptic cleaner which kills germs and bonds with the skin to continue killing germs even after washing.  Oral Hygiene is also important in reducing the risk of infection.  Remember to brush your teeth with your regular toothpaste the morning of surgery.  Please DO NOT use if you have an allergy to CHG or antibacterial soaps.  If your skin becomes reddened/irritated stop using the CHG and inform your nurse when you arrive at Short Stay.  Do not shave (including legs and underarms) for at least 48 hours prior to the first CHG shower.  You may  shave your face.  Please follow these instructions carefully:   1.  Shower with CHG Soap the night before surgery and the morning of Surgery.  2.  If you choose to wash your hair, wash your hair first as usual with your normal shampoo.  3.  After you shampoo, rinse your hair and body thoroughly to remove the shampoo. 4.  Use CHG as you would any other liquid soap.  You can apply chg directly to the skin and wash gently with a      scrungie or washcloth.           5.  Apply the CHG Soap to your body ONLY FROM THE NECK DOWN.   Do not use on open wounds or open sores. Avoid contact with your eyes, ears, mouth and genitals (private parts).  Wash genitals (private parts) with your normal soap.  6.  Wash thoroughly, paying special attention to the area where your surgery will be performed.  7.  Thoroughly rinse your body with warm water from the neck down.  8.  DO NOT shower/wash with your normal soap after using and rinsing off the CHG Soap.  9.  Pat yourself dry with a clean towel.            10.  Wear clean pajamas.            11.  Place clean sheets on your bed the night  of your first shower and do not sleep with pets.  Day of Surgery  Shower as above. Do not apply any lotions/deodorants the morning of surgery.   Please wear clean clothes to the hospital. Remember to brush your teeth with toothpaste.   Please read over the fact sheets that you were given.

## 2018-04-19 ENCOUNTER — Encounter (HOSPITAL_COMMUNITY): Payer: Self-pay

## 2018-04-19 ENCOUNTER — Ambulatory Visit (HOSPITAL_COMMUNITY)
Admission: RE | Admit: 2018-04-19 | Discharge: 2018-04-19 | Disposition: A | Discharge status: Home / Self Care | Payer: BLUE CROSS/BLUE SHIELD | Source: Ambulatory Visit | Attending: Surgery | Admitting: Surgery

## 2018-04-19 ENCOUNTER — Other Ambulatory Visit: Payer: Self-pay

## 2018-04-19 ENCOUNTER — Telehealth (INDEPENDENT_AMBULATORY_CARE_PROVIDER_SITE_OTHER): Payer: Self-pay | Admitting: Orthopaedic Surgery

## 2018-04-19 ENCOUNTER — Encounter (HOSPITAL_COMMUNITY)
Admission: RE | Admit: 2018-04-19 | Discharge: 2018-04-19 | Disposition: A | Discharge status: Home / Self Care | Payer: BLUE CROSS/BLUE SHIELD | Source: Ambulatory Visit | Attending: Orthopaedic Surgery | Admitting: Orthopaedic Surgery

## 2018-04-19 DIAGNOSIS — Z01818 Encounter for other preprocedural examination: Secondary | ICD-10-CM | POA: Insufficient documentation

## 2018-04-19 HISTORY — DX: Ulcerative colitis, unspecified, without complications: K51.90

## 2018-04-19 HISTORY — DX: Unspecified osteoarthritis, unspecified site: M19.90

## 2018-04-19 LAB — CBC
HCT: 40.2 % (ref 39.0–52.0)
Hemoglobin: 14.6 g/dL (ref 13.0–17.0)
MCH: 37.6 pg — ABNORMAL HIGH (ref 26.0–34.0)
MCHC: 36.3 g/dL — ABNORMAL HIGH (ref 30.0–36.0)
MCV: 103.6 fL — ABNORMAL HIGH (ref 80.0–100.0)
NRBC: 1.9 % — AB (ref 0.0–0.2)
Platelets: 529 10*3/uL — ABNORMAL HIGH (ref 150–400)
RBC: 3.88 MIL/uL — ABNORMAL LOW (ref 4.22–5.81)
RDW: 12.9 % (ref 11.5–15.5)
WBC: 3.6 10*3/uL — ABNORMAL LOW (ref 4.0–10.5)

## 2018-04-19 LAB — SURGICAL PCR SCREEN
MRSA, PCR: NEGATIVE
Staphylococcus aureus: NEGATIVE

## 2018-04-19 LAB — URINALYSIS, ROUTINE W REFLEX MICROSCOPIC
Bilirubin Urine: NEGATIVE
Glucose, UA: NEGATIVE mg/dL
Hgb urine dipstick: NEGATIVE
Ketones, ur: NEGATIVE mg/dL
Leukocytes,Ua: NEGATIVE
Nitrite: NEGATIVE
PH: 8 (ref 5.0–8.0)
Protein, ur: NEGATIVE mg/dL
SPECIFIC GRAVITY, URINE: 1.015 (ref 1.005–1.030)

## 2018-04-19 NOTE — Telephone Encounter (Signed)
Clarene Critchley calling from short stay [phone].  Patient's preadmission appointment was today.  Blood sample was hemolyzed. Please advise if patient is to return to hospital to have done prior to surgery or day of.

## 2018-04-19 NOTE — Progress Notes (Addendum)
PCP - Dr. Helyn Numbers Cardiologist - states he saw one "years ago", can't remember the name  Chest x-ray - today EKG - toay Stress Test - denies ECHO - denies Cardiac Cath - denies  Sleep Study - denies CPAP -   Fasting Blood Sugar - n/a Checks Blood Sugar _____ times a day  Blood Thinner Instructions: n/a Aspirin Instructions: n/a  Anesthesia review: yes, EKG. Pt states he had "palpitations" years ago and saw a cardiologist (doesn't remember who it was) and it sounds like they had him wear a Holter monitor. He states the monitor didn't show anything. Have requested records from PCP in case they had results.  CMP hemolyzed - will draw DOS.  Patient denies shortness of breath, fever, cough and chest pain at PAT appointment   Patient verbalized understanding of instructions that were given to them at the PAT appointment. Patient was also instructed that they will need to review over the PAT instructions again at home before surgery.

## 2018-04-19 NOTE — Telephone Encounter (Signed)
Ok ffor them to do CMET when he show up at hospital day of surgery

## 2018-04-19 NOTE — Progress Notes (Signed)
Pt's CMET was hemolyzed, called and spoke with Debbie at Dr. Lorin Mercy' office earlier and she was to contact Dr. Lorin Mercy and ask if pt needed to have another drawn or let it be done day of surgery. Dr. Lorin Mercy called back and left message that pt could have it done day of surgery.

## 2018-04-19 NOTE — Telephone Encounter (Signed)
Please advise 

## 2018-04-19 NOTE — Telephone Encounter (Signed)
Can you relay message? I did not see call back number. Thanks.

## 2018-04-20 NOTE — Anesthesia Preprocedure Evaluation (Addendum)
Anesthesia Evaluation  Patient identified by MRN, date of birth, ID band Patient awake    Reviewed: Allergy & Precautions, H&P , NPO status , Patient's Chart, lab work & pertinent test results, reviewed documented beta blocker date and time   Airway Mallampati: I  TM Distance: >3 FB Neck ROM: full    Dental no notable dental hx. (+) Teeth Intact, Caps   Pulmonary neg pulmonary ROS,    Pulmonary exam normal breath sounds clear to auscultation       Cardiovascular Exercise Tolerance: Good negative cardio ROS   Rhythm:regular Rate:Normal     Neuro/Psych PSYCHIATRIC DISORDERS Depression negative neurological ROS     GI/Hepatic Neg liver ROS, GERD  Medicated,  Endo/Other  negative endocrine ROS  Renal/GU negative Renal ROS  negative genitourinary   Musculoskeletal  (+) Arthritis , Osteoarthritis,    Abdominal   Peds  Hematology negative hematology ROS (+)   Anesthesia Other Findings   Reproductive/Obstetrics negative OB ROS                             Anesthesia Physical Anesthesia Plan  ASA: II  Anesthesia Plan: Spinal   Post-op Pain Management:    Induction:   PONV Risk Score and Plan: 1 and Ondansetron and Treatment may vary due to age or medical condition  Airway Management Planned: Nasal Cannula  Additional Equipment:   Intra-op Plan:   Post-operative Plan:   Informed Consent: I have reviewed the patients History and Physical, chart, labs and discussed the procedure including the risks, benefits and alternatives for the proposed anesthesia with the patient or authorized representative who has indicated his/her understanding and acceptance.     Dental Advisory Given  Plan Discussed with: CRNA, Anesthesiologist and Surgeon  Anesthesia Plan Comments: (Pt reported hx of palpitations many years ago, said he had an evaluation that was benign. PCP office does not have any  cardiac studies on file. EKG 04/19/18 shows sinus rhythm with one PVC. Denies any current CV symptoms. )       Anesthesia Quick Evaluation

## 2018-04-22 ENCOUNTER — Telehealth (INDEPENDENT_AMBULATORY_CARE_PROVIDER_SITE_OTHER): Payer: Self-pay

## 2018-04-22 NOTE — Telephone Encounter (Signed)
I received a voicemail from [employer] stating she has been trying to reach pt by phone to go over some preop and post op info and she can't reach him and wanted to know if we had an additional # for him. # they have is [phone]. She did not leave her phone # though for me to be able to call her back.

## 2018-04-28 ENCOUNTER — Inpatient Hospital Stay (HOSPITAL_COMMUNITY)
Admission: RE | Admit: 2018-04-28 | Discharge: 2018-04-30 | DRG: 470 | Disposition: A | Discharge status: Home / Self Care | Payer: BLUE CROSS/BLUE SHIELD | Attending: Orthopaedic Surgery | Admitting: Orthopaedic Surgery

## 2018-04-28 ENCOUNTER — Inpatient Hospital Stay (HOSPITAL_COMMUNITY): Payer: BLUE CROSS/BLUE SHIELD

## 2018-04-28 ENCOUNTER — Encounter (HOSPITAL_COMMUNITY): Payer: Self-pay | Admitting: *Deleted

## 2018-04-28 ENCOUNTER — Other Ambulatory Visit: Payer: Self-pay

## 2018-04-28 ENCOUNTER — Inpatient Hospital Stay (HOSPITAL_COMMUNITY): Payer: BLUE CROSS/BLUE SHIELD | Admitting: Anesthesiology

## 2018-04-28 ENCOUNTER — Encounter (HOSPITAL_COMMUNITY)
Admission: RE | Disposition: A | Discharge status: Home / Self Care | Payer: Self-pay | Source: Home / Self Care | Attending: Orthopaedic Surgery

## 2018-04-28 ENCOUNTER — Inpatient Hospital Stay (HOSPITAL_COMMUNITY): Payer: BLUE CROSS/BLUE SHIELD | Admitting: Physician Assistant

## 2018-04-28 DIAGNOSIS — Z79899 Other long term (current) drug therapy: Secondary | ICD-10-CM

## 2018-04-28 DIAGNOSIS — M8788 Other osteonecrosis, other site: Secondary | ICD-10-CM | POA: Diagnosis present

## 2018-04-28 DIAGNOSIS — K219 Gastro-esophageal reflux disease without esophagitis: Secondary | ICD-10-CM | POA: Diagnosis present

## 2018-04-28 DIAGNOSIS — M87051 Idiopathic aseptic necrosis of right femur: Secondary | ICD-10-CM | POA: Diagnosis present

## 2018-04-28 DIAGNOSIS — M1612 Unilateral primary osteoarthritis, left hip: Secondary | ICD-10-CM | POA: Diagnosis present

## 2018-04-28 DIAGNOSIS — Z791 Long term (current) use of non-steroidal anti-inflammatories (NSAID): Secondary | ICD-10-CM | POA: Diagnosis not present

## 2018-04-28 DIAGNOSIS — M87052 Idiopathic aseptic necrosis of left femur: Secondary | ICD-10-CM | POA: Diagnosis not present

## 2018-04-28 DIAGNOSIS — Z419 Encounter for procedure for purposes other than remedying health state, unspecified: Secondary | ICD-10-CM

## 2018-04-28 DIAGNOSIS — F329 Major depressive disorder, single episode, unspecified: Secondary | ICD-10-CM | POA: Diagnosis present

## 2018-04-28 DIAGNOSIS — Z09 Encounter for follow-up examination after completed treatment for conditions other than malignant neoplasm: Secondary | ICD-10-CM

## 2018-04-28 HISTORY — PX: TOTAL HIP ARTHROPLASTY: SHX124

## 2018-04-28 LAB — COMPREHENSIVE METABOLIC PANEL
ALK PHOS: 51 U/L (ref 38–126)
ALT: 23 U/L (ref 0–44)
AST: 27 U/L (ref 15–41)
Albumin: 4.2 g/dL (ref 3.5–5.0)
Anion gap: 8 (ref 5–15)
BUN: 14 mg/dL (ref 6–20)
CO2: 24 mmol/L (ref 22–32)
Calcium: 9.3 mg/dL (ref 8.9–10.3)
Chloride: 107 mmol/L (ref 98–111)
Creatinine, Ser: 0.7 mg/dL (ref 0.61–1.24)
GFR calc Af Amer: >60 mL/min (ref >60)
GFR calc non Af Amer: >60 mL/min (ref >60)
Glucose, Bld: 106 mg/dL — ABNORMAL HIGH (ref 70–99)
Potassium: 4 mmol/L (ref 3.5–5.1)
Sodium: 139 mmol/L (ref 135–145)
Total Bilirubin: 1 mg/dL (ref 0.3–1.2)
Total Protein: 6.9 g/dL (ref 6.5–8.1)

## 2018-04-28 SURGERY — ARTHROPLASTY, HIP, TOTAL, ANTERIOR APPROACH
Anesthesia: Spinal | Site: Hip | Laterality: Left

## 2018-04-28 MED ORDER — LIDOCAINE HCL (CARDIAC) PF 100 MG/5ML IV SOSY
PREFILLED_SYRINGE | INTRAVENOUS | Status: DC | PRN
  Administered 2018-04-28: 100 mg via INTRAVENOUS

## 2018-04-28 MED ORDER — CHLORHEXIDINE GLUCONATE 4 % EX LIQD: 60.0000 mL | Freq: Once | CUTANEOUS | Status: DC

## 2018-04-28 MED ORDER — PROPOFOL 10 MG/ML IV BOLUS
INTRAVENOUS | Status: DC | PRN
  Administered 2018-04-28 (×3): 30 mg via INTRAVENOUS

## 2018-04-28 MED ORDER — MEPERIDINE HCL 50 MG/ML IJ SOLN: 6.2500 mg | INTRAMUSCULAR | Status: DC | PRN

## 2018-04-28 MED ORDER — FENTANYL CITRATE (PF) 250 MCG/5ML IJ SOLN
INTRAMUSCULAR | Status: AC
  Filled 2018-04-28: qty 5

## 2018-04-28 MED ORDER — 0.9 % SODIUM CHLORIDE (POUR BTL) OPTIME
TOPICAL | Status: DC | PRN
  Administered 2018-04-28: 1000 mL

## 2018-04-28 MED ORDER — DOCUSATE SODIUM 100 MG PO CAPS
100.0000 mg | ORAL_CAPSULE | Freq: Two times a day (BID) | ORAL | Status: DC
  Administered 2018-04-28 – 2018-04-30 (×4): 100 mg via ORAL
  Filled 2018-04-28 (×4): qty 1

## 2018-04-28 MED ORDER — EPHEDRINE SULFATE 50 MG/ML IJ SOLN
INTRAMUSCULAR | Status: DC | PRN
  Administered 2018-04-28: 10 mg via INTRAVENOUS

## 2018-04-28 MED ORDER — BUPIVACAINE-EPINEPHRINE 0.25% -1:200000 IJ SOLN
INTRAMUSCULAR | Status: DC | PRN
  Administered 2018-04-28: 10 mL

## 2018-04-28 MED ORDER — SODIUM CHLORIDE 0.9 % IV SOLN
INTRAVENOUS | Status: DC
  Administered 2018-04-28: 19:00:00 via INTRAVENOUS

## 2018-04-28 MED ORDER — OXYCODONE HCL 5 MG/5ML PO SOLN: 5.0000 mg | Freq: Once | ORAL | Status: DC | PRN

## 2018-04-28 MED ORDER — MENTHOL 3 MG MT LOZG: 1.0000 | LOZENGE | OROMUCOSAL | Status: DC | PRN

## 2018-04-28 MED ORDER — PHENYLEPHRINE HCL 10 MG/ML IJ SOLN
INTRAMUSCULAR | Status: DC | PRN
  Administered 2018-04-28 (×3): 80 ug via INTRAVENOUS

## 2018-04-28 MED ORDER — OXYCODONE HCL 5 MG PO TABS: 5.0000 mg | ORAL_TABLET | Freq: Once | ORAL | Status: DC | PRN

## 2018-04-28 MED ORDER — CEFAZOLIN SODIUM-DEXTROSE 2-4 GM/100ML-% IV SOLN
2.0000 g | INTRAVENOUS | Status: AC
  Administered 2018-04-28: 2 g via INTRAVENOUS

## 2018-04-28 MED ORDER — METHOCARBAMOL 1000 MG/10ML IJ SOLN
500.0000 mg | Freq: Four times a day (QID) | INTRAVENOUS | Status: DC | PRN
  Filled 2018-04-28: qty 5

## 2018-04-28 MED ORDER — MIDAZOLAM HCL 2 MG/2ML IJ SOLN
INTRAMUSCULAR | Status: AC
  Filled 2018-04-28: qty 2

## 2018-04-28 MED ORDER — ASPIRIN EC 325 MG PO TBEC
325.0000 mg | DELAYED_RELEASE_TABLET | Freq: Every day | ORAL | Status: DC
  Administered 2018-04-29 – 2018-04-30 (×2): 325 mg via ORAL
  Filled 2018-04-28 (×2): qty 1

## 2018-04-28 MED ORDER — PROPOFOL 10 MG/ML IV BOLUS
INTRAVENOUS | Status: AC
  Filled 2018-04-28: qty 20

## 2018-04-28 MED ORDER — CEFAZOLIN SODIUM-DEXTROSE 1-4 GM/50ML-% IV SOLN
1.0000 g | Freq: Three times a day (TID) | INTRAVENOUS | Status: AC
  Administered 2018-04-28 – 2018-04-29 (×2): 1 g via INTRAVENOUS
  Filled 2018-04-28 (×2): qty 50

## 2018-04-28 MED ORDER — OXYCODONE HCL 5 MG PO TABS
5.0000 mg | ORAL_TABLET | ORAL | Status: DC | PRN
  Administered 2018-04-28 – 2018-04-29 (×4): 10 mg via ORAL
  Filled 2018-04-28 (×4): qty 2

## 2018-04-28 MED ORDER — ONDANSETRON HCL 4 MG PO TABS: 4.0000 mg | ORAL_TABLET | Freq: Four times a day (QID) | ORAL | Status: DC | PRN

## 2018-04-28 MED ORDER — METOCLOPRAMIDE HCL 5 MG PO TABS: 5.0000 mg | ORAL_TABLET | Freq: Three times a day (TID) | ORAL | Status: DC | PRN

## 2018-04-28 MED ORDER — MIDAZOLAM HCL 2 MG/2ML IJ SOLN
INTRAMUSCULAR | Status: DC | PRN
  Administered 2018-04-28: 2 mg via INTRAVENOUS

## 2018-04-28 MED ORDER — ACETAMINOPHEN 325 MG PO TABS: 325.0000 mg | ORAL_TABLET | Freq: Four times a day (QID) | ORAL | Status: DC | PRN

## 2018-04-28 MED ORDER — PROPOFOL 500 MG/50ML IV EMUL
INTRAVENOUS | Status: DC | PRN
  Administered 2018-04-28: 75 ug/kg/min via INTRAVENOUS
  Administered 2018-04-28: 100 ug/kg/min via INTRAVENOUS

## 2018-04-28 MED ORDER — ACETAMINOPHEN 160 MG/5ML PO SOLN: 325.0000 mg | ORAL | Status: DC | PRN

## 2018-04-28 MED ORDER — METOCLOPRAMIDE HCL 5 MG/ML IJ SOLN: 5.0000 mg | Freq: Three times a day (TID) | INTRAMUSCULAR | Status: DC | PRN

## 2018-04-28 MED ORDER — PHENOL 1.4 % MT LIQD: 1.0000 | OROMUCOSAL | Status: DC | PRN

## 2018-04-28 MED ORDER — BUPIVACAINE-EPINEPHRINE (PF) 0.25% -1:200000 IJ SOLN
INTRAMUSCULAR | Status: AC
  Filled 2018-04-28: qty 30

## 2018-04-28 MED ORDER — METHOCARBAMOL 500 MG PO TABS
500.0000 mg | ORAL_TABLET | Freq: Four times a day (QID) | ORAL | Status: DC | PRN
  Administered 2018-04-28 – 2018-04-29 (×3): 500 mg via ORAL
  Filled 2018-04-28 (×3): qty 1

## 2018-04-28 MED ORDER — ONDANSETRON HCL 4 MG/2ML IJ SOLN: 4.0000 mg | Freq: Once | INTRAMUSCULAR | Status: DC | PRN

## 2018-04-28 MED ORDER — CEFAZOLIN SODIUM-DEXTROSE 2-4 GM/100ML-% IV SOLN
INTRAVENOUS | Status: AC
  Filled 2018-04-28: qty 100

## 2018-04-28 MED ORDER — FENTANYL CITRATE (PF) 100 MCG/2ML IJ SOLN: 25.0000 ug | INTRAMUSCULAR | Status: DC | PRN

## 2018-04-28 MED ORDER — ALBUMIN HUMAN 5 % IV SOLN
INTRAVENOUS | Status: DC | PRN
  Administered 2018-04-28: 15:00:00 via INTRAVENOUS

## 2018-04-28 MED ORDER — POLYETHYLENE GLYCOL 3350 17 G PO PACK: 17.0000 g | PACK | Freq: Every day | ORAL | Status: DC | PRN

## 2018-04-28 MED ORDER — LACTATED RINGERS IV SOLN
INTRAVENOUS | Status: DC
  Administered 2018-04-28 (×2): via INTRAVENOUS

## 2018-04-28 MED ORDER — BUPIVACAINE LIPOSOME 1.3 % IJ SUSP
20.0000 mL | INTRAMUSCULAR | Status: AC
  Administered 2018-04-28: 10 mL
  Filled 2018-04-28: qty 20

## 2018-04-28 MED ORDER — ONDANSETRON HCL 4 MG/2ML IJ SOLN: 4.0000 mg | Freq: Four times a day (QID) | INTRAMUSCULAR | Status: DC | PRN

## 2018-04-28 MED ORDER — HYDROMORPHONE HCL 1 MG/ML IJ SOLN
0.5000 mg | INTRAMUSCULAR | Status: DC | PRN
  Administered 2018-04-29: 1 mg via INTRAVENOUS
  Filled 2018-04-28: qty 1

## 2018-04-28 MED ORDER — ACETAMINOPHEN 325 MG PO TABS: 325.0000 mg | ORAL_TABLET | ORAL | Status: DC | PRN

## 2018-04-28 SURGICAL SUPPLY — 55 items
BENZOIN TINCTURE PRP APPL 2/3 (GAUZE/BANDAGES/DRESSINGS) ×3 IMPLANT
BLADE CLIPPER SURG (BLADE) IMPLANT
BLADE SAW SGTL 18X1.27X75 (BLADE) ×2 IMPLANT
BLADE SAW SGTL 18X1.27X75MM (BLADE) ×1
CELLS DAT CNTRL 66122 CELL SVR (MISCELLANEOUS) ×1 IMPLANT
CLOSURE WOUND 1/2 X4 (GAUZE/BANDAGES/DRESSINGS) ×1
COVER SURGICAL LIGHT HANDLE (MISCELLANEOUS) ×3 IMPLANT
COVER WAND RF STERILE (DRAPES) ×3 IMPLANT
CUP PINN GRIPTON 54 100 (Cup) ×2 IMPLANT
DRAPE C-ARM 42X72 X-RAY (DRAPES) ×3 IMPLANT
DRAPE IMP U-DRAPE 54X76 (DRAPES) ×3 IMPLANT
DRAPE INCISE IOBAN 66X45 STRL (DRAPES) ×2 IMPLANT
DRAPE STERI IOBAN 125X83 (DRAPES) ×3 IMPLANT
DRAPE U-SHAPE 47X51 STRL (DRAPES) ×9 IMPLANT
DRSG MEPILEX BORDER 4X8 (GAUZE/BANDAGES/DRESSINGS) ×3 IMPLANT
DURAPREP 26ML APPLICATOR (WOUND CARE) ×3 IMPLANT
ELECT BLADE 4.0 EZ CLEAN MEGAD (MISCELLANEOUS)
ELECT CAUTERY BLADE 6.4 (BLADE) ×3 IMPLANT
ELECT REM PT RETURN 9FT ADLT (ELECTROSURGICAL) ×3
ELECTRODE BLDE 4.0 EZ CLN MEGD (MISCELLANEOUS) IMPLANT
ELECTRODE REM PT RTRN 9FT ADLT (ELECTROSURGICAL) ×1 IMPLANT
ELIMINATOR HOLE APEX DEPUY (Hips) ×2 IMPLANT
FACESHIELD WRAPAROUND (MASK) ×6 IMPLANT
FACESHIELD WRAPAROUND OR TEAM (MASK) ×2 IMPLANT
GLOVE BIOGEL PI IND STRL 8 (GLOVE) ×2 IMPLANT
GLOVE BIOGEL PI INDICATOR 8 (GLOVE) ×4
GLOVE ORTHO TXT STRL SZ7.5 (GLOVE) ×6 IMPLANT
GOWN STRL REUS W/ TWL LRG LVL3 (GOWN DISPOSABLE) ×1 IMPLANT
GOWN STRL REUS W/ TWL XL LVL3 (GOWN DISPOSABLE) ×1 IMPLANT
GOWN STRL REUS W/TWL 2XL LVL3 (GOWN DISPOSABLE) ×3 IMPLANT
GOWN STRL REUS W/TWL LRG LVL3 (GOWN DISPOSABLE) ×2
GOWN STRL REUS W/TWL XL LVL3 (GOWN DISPOSABLE) ×2
HEAD CERAMIC 36 PLUS5 (Hips) ×2 IMPLANT
KIT BASIN OR (CUSTOM PROCEDURE TRAY) ×3 IMPLANT
KIT TURNOVER KIT B (KITS) ×3 IMPLANT
LINER NEUTRAL 54X36MM PLUS 4 (Hips) ×2 IMPLANT
MANIFOLD NEPTUNE II (INSTRUMENTS) ×3 IMPLANT
NS IRRIG 1000ML POUR BTL (IV SOLUTION) ×3 IMPLANT
PACK TOTAL JOINT (CUSTOM PROCEDURE TRAY) ×3 IMPLANT
PAD ARMBOARD 7.5X6 YLW CONV (MISCELLANEOUS) ×6 IMPLANT
RETRACTOR WND ALEXIS 18 MED (MISCELLANEOUS) ×1 IMPLANT
RTRCTR WOUND ALEXIS 18CM MED (MISCELLANEOUS) ×3
STEM FEM ACTIS STD SZ4 (Stem) ×2 IMPLANT
STRIP CLOSURE SKIN 1/2X4 (GAUZE/BANDAGES/DRESSINGS) ×2 IMPLANT
SUT VIC AB 0 CT1 27 (SUTURE) ×2
SUT VIC AB 0 CT1 27XBRD ANBCTR (SUTURE) ×1 IMPLANT
SUT VIC AB 2-0 CT1 27 (SUTURE) ×2
SUT VIC AB 2-0 CT1 TAPERPNT 27 (SUTURE) ×1 IMPLANT
SUT VICRYL 4-0 PS2 18IN ABS (SUTURE) ×3 IMPLANT
SUT VLOC 180 0 24IN GS25 (SUTURE) ×3 IMPLANT
TOWEL OR 17X24 6PK STRL BLUE (TOWEL DISPOSABLE) ×3 IMPLANT
TOWEL OR 17X26 10 PK STRL BLUE (TOWEL DISPOSABLE) ×6 IMPLANT
TRAY CATH 16FR W/PLASTIC CATH (SET/KITS/TRAYS/PACK) IMPLANT
TRAY FOLEY MTR SLVR 16FR STAT (SET/KITS/TRAYS/PACK) IMPLANT
WATER STERILE IRR 1000ML POUR (IV SOLUTION) ×6 IMPLANT

## 2018-04-28 NOTE — Transfer of Care (Signed)
Immediate Anesthesia Transfer of Care Note  Patient: Brett Fowler  Procedure(s) Performed: LEFT TOTAL HIP ARTHROPLASTY DIRECT ANTERIOR (Left Hip)  Patient Location: PACU  Anesthesia Type:MAC and Spinal  Level of Consciousness: awake, alert  and oriented  Airway & Oxygen Therapy: Patient Spontanous Breathing and Patient connected to face mask oxygen  Post-op Assessment: Report given to RN and Post -op Vital signs reviewed and stable  Post vital signs: Reviewed and stable  Last Vitals:  Vitals Value Taken Time  BP 115/74 04/28/2018  3:58 PM  Temp    Pulse 59 04/28/2018  3:59 PM  Resp 9 04/28/2018  3:59 PM  SpO2 98 % 04/28/2018  3:59 PM  Vitals shown include unvalidated device data.  Last Pain:  Vitals:   04/28/18 1050  TempSrc: Oral  PainSc: 0-No pain      Patients Stated Pain Goal: 0 (56/31/49 7026)  Complications: No apparent anesthesia complications

## 2018-04-28 NOTE — Op Note (Signed)
Preop diagnosis: Left hip avascular necrosis with secondary osteoarthritis  Postop diagnosis: Same  Procedure: Left total hip arthroplasty direct anterior approach.  Anesthesia: General plus Marcaine and Exparel.  Surgeon: Rodell Perna, MD  Assistant: Benjiman Core, PA-C medically necessary and present for the entire procedure  EBL see anesthetic record  Implants: #4 standard size 4 Actis stem Depuy, 36+5 ceramic head.  5436 neutral liner +4.  Grip shin 54 mm series 100 cup apex hole eliminator.  Procedure after induction of spinal anesthesia space is placed in the Hana boot.  Careful positioning C arm was brought in with good visitation of the hip.  To 15 drapes were applied above the iliac crest and now by the knee hip was prepped with ChloraPrep and the usual split sticky sheets drapes large shower curtain Betadine Steri-Drape was applied after sterile skin marker was used for direct anterior approach.  Timeout procedure was completed Ancef prophylactically.  Incision was made starting 2 cm inferior 1 cm lateral to the ASIS passing obliquely down to the trochanter.  Subcutaneous tissue was elevated skin retractor placed.  Fascia split in line with the skin incision elevated medially and then transverse vessels were coagulated fat was removed over the top of the capsule capsule was open and then Del cobras were placed on each side of the neck visualization C-arm was brought in neck was cut 1 fingerbreadth above the lesser trochanter.  Head was moved with a corkscrew progressive reaming up to 53 size for 54 cup excellent fit and centralization.  Cup was inserted +4 neutral liner was inserted.  Hydraulic hook was applied leg was taken down and under and then once meal was placed medially on the neck and large long Homan placed behind the trochanter preparation the canal was performed progressing up to the #4 size Actis stem.  Good fit good version trials were used.  Neck length was selected restoring  the length of fluoroscopic pictures with a line drawn across underneath the pelvis and in both lesser tuberosities at the identical level.  Permanent stem was inserted ceramic ball selected for patient's young age of 19.  V lock closure the deep fascia 2 in the subtenons tissue staple closure of the skin infiltration Marcaine Exparel prior to closure postop dressing and transfer the care of room.

## 2018-04-28 NOTE — Anesthesia Procedure Notes (Signed)
Spinal  Patient location during procedure: OR Start time: 04/28/2018 1:42 PM End time: 04/28/2018 1:44 PM Staffing Anesthesiologist: Janeece Riggers, MD Preanesthetic Checklist Completed: patient identified, site marked, surgical consent, pre-op evaluation, timeout performed, IV checked, risks and benefits discussed and monitors and equipment checked Spinal Block Patient position: sitting Prep: DuraPrep Patient monitoring: heart rate, cardiac monitor, continuous pulse ox and blood pressure Approach: midline Location: L3-4 Injection technique: single-shot Needle Needle type: Sprotte  Needle gauge: 24 G Needle length: 9 cm Assessment Sensory level: T4

## 2018-04-28 NOTE — Progress Notes (Signed)
Pt arrived to room 5N06 via bed after surgery. Received report from Mickel Baas, [place] in PACU. See assessment. Will continue to monitor.

## 2018-04-28 NOTE — Progress Notes (Signed)
Physical Therapy Evaluation Patient Details Name: Brett Fowler MRN: [id number] DOB: 05-28-1960 Today's Date: 04/28/2018   History of Present Illness  Patient is 58 y/o male s/p L THA. PMH includes depression and avascular necrosis.   Clinical Impression  Patient admitted to hospital secondary to problems above and with deficits below. Patient required min guard to stand and transfer to chair with use of RW. Functional mobility limited secondary to decreased sensation in LLE. Educated and reviewed supine HEP with patient and family. Patient will benefit from acute physical therapy to maximize independence and safety with functional mobility.     Follow Up Recommendations Follow surgeon's recommendation for DC plan and follow-up therapies    Equipment Recommendations  Rolling walker with 5" wheels;3in1 (PT)    Recommendations for Other Services       Precautions / Restrictions Precautions Precautions: None Restrictions Weight Bearing Restrictions: Yes LLE Weight Bearing: Weight bearing as tolerated      Mobility  Bed Mobility Overal bed mobility: Needs Assistance Bed Mobility: Supine to Sit     Supine to sit: Supervision     General bed mobility comments: Patient required supervision for bed mobility for safety  Transfers Overall transfer level: Needs assistance Equipment used: Rolling walker (2 wheeled) Transfers: Sit to/from [facility] Sit to Stand: Min guard Stand pivot transfers: Min guard       General transfer comment: Patient required min guard to stand and transfer to chair with use of RW for safety. Required verbal cues for hand placement prior to stand with RW. Requried cues for sequencing with RW. Mobility limited secondary to decreased sensation in LLE.   Ambulation/Gait                Stairs            Wheelchair Mobility    Modified Rankin (Stroke Patients Only)       Balance Overall balance assessment: Needs  assistance Sitting-balance support: No upper extremity supported;Feet supported Sitting balance-Leahy Scale: Good     Standing balance support: Bilateral upper extremity supported Standing balance-Leahy Scale: Poor Standing balance comment: Patient reliant on BUE support to maintain standing balance                             Pertinent Vitals/Pain Pain Assessment: No/denies pain    Home Living Family/patient expects to be discharged to:: Private residence Living Arrangements: Spouse/significant other Available Help at Discharge: Family;Available 24 hours/day Type of Home: House Home Access: Stairs to enter Entrance Stairs-Rails: Right;Left;Can reach both Entrance Stairs-Number of Steps: 3 Home Layout: One level Home Equipment: None      Prior Function Level of Independence: Independent               Hand Dominance        Extremity/Trunk Assessment   Upper Extremity Assessment Upper Extremity Assessment: Overall WFL for tasks assessed    Lower Extremity Assessment Lower Extremity Assessment: LLE deficits/detail LLE Deficits / Details: LLE deficits consistent with post op pain and weakness LLE Sensation: decreased light touch    Cervical / Trunk Assessment Cervical / Trunk Assessment: Normal  Communication   Communication: No difficulties  Cognition Arousal/Alertness: Awake/alert Behavior During Therapy: WFL for tasks assessed/performed Overall Cognitive Status: Within Functional Limits for tasks assessed  General Comments General comments (skin integrity, edema, etc.): Patient family in room during session    Exercises Total Joint Exercises Ankle Circles/Pumps: AROM;Both;20 reps;Supine Quad Sets: AROM;Left;10 reps;Supine Heel Slides: AROM;Left;10 reps;Supine   Assessment/Plan    PT Assessment Patient needs continued PT services  PT Problem List Decreased strength;Decreased range  of motion;Decreased activity tolerance;Decreased balance;Decreased mobility;Decreased knowledge of use of DME;Decreased knowledge of precautions       PT Treatment Interventions DME instruction;Gait training;Stair training;Functional mobility training;Therapeutic activities;Therapeutic exercise;Balance training;Patient/family education    PT Goals (Current goals can be found in the Care Plan section)  Acute Rehab PT Goals Patient Stated Goal: "get back to walking" PT Goal Formulation: With patient Time For Goal Achievement: 05/12/18 Potential to Achieve Goals: Good    Frequency 7X/week   Barriers to discharge        Co-evaluation               AM-PAC PT "6 Clicks" Mobility  Outcome Measure Help needed turning from your back to your side while in a flat bed without using bedrails?: None Help needed moving from lying on your back to sitting on the side of a flat bed without using bedrails?: None Help needed moving to and from a bed to a chair (including a wheelchair)?: A Little Help needed standing up from a chair using your arms (e.g., wheelchair or bedside chair)?: A Little Help needed to walk in hospital room?: A Little Help needed climbing 3-5 steps with a railing? : A Lot 6 Click Score: 19    End of Session Equipment Utilized During Treatment: Gait belt Activity Tolerance: Patient tolerated treatment well Patient left: in chair;with call bell/phone within reach;with family/visitor present Nurse Communication: Mobility status PT Visit Diagnosis: Other abnormalities of gait and mobility (R26.89);Difficulty in walking, not elsewhere classified (R26.2);Muscle weakness (generalized) (M62.81)    Time: 3329-5188 PT Time Calculation (min) (ACUTE ONLY): 21 min   Charges:   PT Evaluation $PT Eval Low Complexity: 1 Low          Erick Blinks, SPT  Erick Blinks 04/28/2018, 6:35 PM

## 2018-04-28 NOTE — Plan of Care (Signed)
  Problem: Education: Goal: Knowledge of General Education information will improve Description Including pain rating scale, medication(s)/side effects and non-pharmacologic comfort measures Outcome: Progressing   Problem: Clinical Measurements: Goal: Ability to maintain clinical measurements within normal limits will improve Outcome: Progressing Goal: Respiratory complications will improve Outcome: Not Applicable   Problem: Elimination: Goal: Will not experience complications related to bowel motility Outcome: Progressing Goal: Will not experience complications related to urinary retention Outcome: Progressing   Problem: Pain Managment: Goal: General experience of comfort will improve Outcome: Progressing

## 2018-04-28 NOTE — Interval H&P Note (Signed)
History and Physical Interval Note:  04/28/2018 12:00 PM  Brett Fowler  has presented today for surgery, with the diagnosis of LEFT HIP AVASCULAR NECROSIS.  The various methods of treatment have been discussed with the patient and family. After consideration of risks, benefits and other options for treatment, the patient has consented to  Procedure(s): LEFT TOTAL HIP ARTHROPLASTY DIRECT ANTERIOR (Left) as a surgical intervention.  The patient's history has been reviewed, patient examined, no change in status, stable for surgery.  I have reviewed the patient's chart and labs.  Questions were answered to the patient's satisfaction.     [name]

## 2018-04-28 NOTE — Plan of Care (Signed)
Problem: Education: Goal: Knowledge of General Education information will improve Description Including pain rating scale, medication(s)/side effects and non-pharmacologic comfort measures Outcome: Progressing   Problem: Health Behavior/Discharge Planning: Goal: Ability to manage health-related needs will improve Outcome: Progressing   Problem: Clinical Measurements: Goal: Respiratory complications will improve Outcome: Progressing Goal: Cardiovascular complication will be avoided Outcome: Progressing   Problem: Activity: Goal: Risk for activity intolerance will decrease Outcome: Progressing   Problem: Nutrition: Goal: Adequate nutrition will be maintained Outcome: Progressing   Problem: Coping: Goal: Level of anxiety will decrease Outcome: Progressing   Problem: Pain Managment: Goal: General experience of comfort will improve Outcome: Progressing   Problem: Safety: Goal: Ability to remain free from injury will improve Outcome: Progressing   Problem: Skin Integrity: Goal: Risk for impaired skin integrity will decrease Outcome: Progressing

## 2018-04-29 ENCOUNTER — Encounter (HOSPITAL_COMMUNITY): Payer: Self-pay | Admitting: Orthopaedic Surgery

## 2018-04-29 LAB — CBC
HCT: 34.9 % — ABNORMAL LOW (ref 39.0–52.0)
HEMOGLOBIN: 12 g/dL — AB (ref 13.0–17.0)
MCH: 35.4 pg — ABNORMAL HIGH (ref 26.0–34.0)
MCHC: 34.4 g/dL (ref 30.0–36.0)
MCV: 102.9 fL — ABNORMAL HIGH (ref 80.0–100.0)
Platelets: 177 10*3/uL (ref 150–400)
RBC: 3.39 MIL/uL — ABNORMAL LOW (ref 4.22–5.81)
RDW: 12.5 % (ref 11.5–15.5)
WBC: 6.2 10*3/uL (ref 4.0–10.5)
nRBC: 0 % (ref 0.0–0.2)

## 2018-04-29 LAB — BASIC METABOLIC PANEL
Anion gap: 7 (ref 5–15)
BUN: 17 mg/dL (ref 6–20)
CO2: 27 mmol/L (ref 22–32)
Calcium: 8.5 mg/dL — ABNORMAL LOW (ref 8.9–10.3)
Chloride: 105 mmol/L (ref 98–111)
Creatinine, Ser: 0.77 mg/dL (ref 0.61–1.24)
GFR calc Af Amer: >60 mL/min (ref >60)
GFR calc non Af Amer: >60 mL/min (ref >60)
Glucose, Bld: 161 mg/dL — ABNORMAL HIGH (ref 70–99)
POTASSIUM: 4.3 mmol/L (ref 3.5–5.1)
Sodium: 139 mmol/L (ref 135–145)

## 2018-04-29 MED ORDER — PANTOPRAZOLE SODIUM 40 MG PO TBEC
40.0000 mg | DELAYED_RELEASE_TABLET | Freq: Every day | ORAL | Status: DC
  Administered 2018-04-29 – 2018-04-30 (×2): 40 mg via ORAL
  Filled 2018-04-29 (×2): qty 1

## 2018-04-29 NOTE — TOC Initial Note (Signed)
Transition of Care [facility]) - Initial/Assessment Note    Patient Details  Name: Brett Fowler MRN: [id number] Date of Birth: 29-Nov-1960  [facility] of [facility]) CM/SW Contact:    Alberteen Sam, LCSW Phone Number: 04/29/2018, 2:15 PM  Clinical Narrative:                  CSW and RNCM met with patient who reports he currently lives in a single story home with his wife. Patient reports needing rolling walker and 3 in 1. CSW will arrange equipment, no home health needed at this time.   Expected Discharge Plan: Home/Self Care Barriers to Discharge: No Barriers Identified   Patient Goals and CMS Choice Patient states their goals for this hospitalization and ongoing recovery are:: to go home to be with his wife   Choice offered to / list presented to : NA  Expected Discharge Plan and Services Expected Discharge Plan: Home/Self Care Discharge Planning Services: CM Consult Post Acute Care Choice: Durable Medical Equipment Living arrangements for the past 2 months: Single Family Home                 DME Arranged: 3-N-1, Walker rolling DME Agency: [employer] HH Arranged: NA HH Agency: NA  Prior Living Arrangements/Services Living arrangements for the past 2 months: Single Family Home Lives with:: Spouse Patient language and need for interpreter reviewed:: Yes Do you feel safe going back to the place where you live?: Yes      Need for Family Participation in Patient Care: No (Comment) Care giver support system in place?: Yes (comment)   Criminal Activity/Legal Involvement Pertinent to Current Situation/Hospitalization: No - Comment as needed  Activities of Daily Living Home Assistive Devices/Equipment: Eyeglasses ADL Screening (condition at time of admission) Patient's cognitive ability adequate to safely complete daily activities?: Yes Is the patient deaf or have difficulty hearing?: No Does the patient have difficulty seeing, even when wearing glasses/contacts?: No Does the  patient have difficulty concentrating, remembering, or making decisions?: No Patient able to express need for assistance with ADLs?: Yes Does the patient have difficulty dressing or bathing?: No Independently performs ADLs?: Yes (appropriate for developmental age) Does the patient have difficulty walking or climbing stairs?: Yes Weakness of Legs: Left Weakness of Arms/Hands: None  Permission Sought/Granted Permission sought to share information with : Case Manager                Emotional Assessment Appearance:: Appears stated age Attitude/Demeanor/Rapport: Gracious, Engaged Affect (typically observed): Accepting, Adaptable Orientation: : Oriented to Self, Oriented to Place, Oriented to  Time, Oriented to Situation Alcohol / Substance Use: Not Applicable Psych Involvement: No (comment)  Admission diagnosis:  LEFT HIP AVASCULAR NECROSIS Patient Active Problem List   Diagnosis Date Noted  . Arthritis of left hip 04/28/2018  . Avascular necrosis of bones of both hips ([place]) 09/22/2016   PCP:  Aleen Campi, NP Pharmacy:   [facility], [place] - [address] [place] [address] Phone: [phone] Fax: [phone]     Social Determinants of Health (SDOH) Interventions    Readmission Risk Interventions 30 Day Unplanned Readmission Risk Score     Admission (Current) from 04/28/2018 in [place]  30 Day Unplanned Readmission Risk Score (%)  7 Filed at 04/29/2018 1200     This score is the patient's risk of an unplanned readmission within 30 days of being discharged (0 -100%). The  score is based on dignosis, age, lab data, medications, orders, and past utilization.   Low:  0-14.9   Medium: 15-21.9   High: 22-29.9   Extreme: 30 and above       No flowsheet data found.

## 2018-04-29 NOTE — Plan of Care (Signed)
Problem: Education: Goal: Knowledge of General Education information will improve Description Including pain rating scale, medication(s)/side effects and non-pharmacologic comfort measures Outcome: Progressing   Problem: Health Behavior/Discharge Planning: Goal: Ability to manage health-related needs will improve Outcome: Progressing   Problem: Clinical Measurements: Goal: Will remain free from infection Outcome: Progressing   Problem: Activity: Goal: Risk for activity intolerance will decrease Outcome: Progressing   Problem: Nutrition: Goal: Adequate nutrition will be maintained Outcome: Progressing   Problem: Coping: Goal: Level of anxiety will decrease Outcome: Progressing   Problem: Elimination: Goal: Will not experience complications related to urinary retention Outcome: Progressing   Problem: Pain Managment: Goal: General experience of comfort will improve Outcome: Progressing   Problem: Safety: Goal: Ability to remain free from injury will improve Outcome: Progressing   Problem: Skin Integrity: Goal: Risk for impaired skin integrity will decrease Outcome: Progressing

## 2018-04-29 NOTE — Progress Notes (Signed)
Physical Therapy Treatment Patient Details Name: Brett Fowler MRN: [id number] DOB: 1960-12-27 Today's Date: 04/29/2018    History of Present Illness Patient is 58 y/o male s/p L THA. PMH includes depression and avascular necrosis.     PT Comments    Patient continues to make progress toward PT goals and overall supervision/min guard for functional transfer/gait/stair training this session. Current plan remains appropriate.    Follow Up Recommendations  Follow surgeon's recommendation for DC plan and follow-up therapies     Equipment Recommendations  Rolling walker with 5" wheels;3in1 (PT)    Recommendations for Other Services       Precautions / Restrictions Precautions Precautions: None Restrictions Weight Bearing Restrictions: Yes LLE Weight Bearing: Weight bearing as tolerated    Mobility  Bed Mobility Overal bed mobility: Modified Independent Bed Mobility: Supine to Sit           General bed mobility comments: pt OOB in chair upon arrival  Transfers Overall transfer level: Needs assistance Equipment used: Rolling walker (2 wheeled) Transfers: Sit to/from Stand Sit to Stand: Supervision         General transfer comment: for safety  Ambulation/Gait Ambulation/Gait assistance: Supervision Gait Distance (Feet): 150 Feet Assistive device: Rolling walker (2 wheeled) Gait Pattern/deviations: Step-through pattern;Decreased weight shift to left Gait velocity: decreased(but improving)   General Gait Details: pt with good carry over from previous session and improving with stride length; still relying on bilat UE support    Stairs Stairs: Yes Stairs assistance: Min guard Stair Management: One rail Left;Sideways;Step to pattern;Two rails;Forwards Number of Stairs: (4 total) General stair comments: cues for sequencing and technique; practiced with L hand rail going sideways and forward with bilat rails    Wheelchair Mobility    Modified Rankin (Stroke  Patients Only)       Balance Overall balance assessment: Needs assistance Sitting-balance support: No upper extremity supported;Feet supported Sitting balance-Leahy Scale: Good     Standing balance support: Bilateral upper extremity supported;During functional activity Standing balance-Leahy Scale: Fair Standing balance comment: pt is able to static stand without UE support                             Cognition Arousal/Alertness: Awake/alert Behavior During Therapy: WFL for tasks assessed/performed Overall Cognitive Status: Within Functional Limits for tasks assessed                                        Exercises      General Comments        Pertinent Vitals/Pain Pain Assessment: Faces Pain Score: 4  Faces Pain Scale: Hurts little more Pain Location: L LE Pain Descriptors / Indicators: Sore Pain Intervention(s): Limited activity within patient's tolerance;Monitored during session;Repositioned;Premedicated before session    Home Living                      Prior Function            PT Goals (current goals can now be found in the care plan section) Progress towards PT goals: Progressing toward goals    Frequency    7X/week      PT Plan Current plan remains appropriate    Co-evaluation              AM-PAC PT "6 Clicks" Mobility   Outcome Measure  Help needed turning from your back to your side while in a flat bed without using bedrails?: None Help needed moving from lying on your back to sitting on the side of a flat bed without using bedrails?: None Help needed moving to and from a bed to a chair (including a wheelchair)?: A Little Help needed standing up from a chair using your arms (e.g., wheelchair or bedside chair)?: None Help needed to walk in hospital room?: None Help needed climbing 3-5 steps with a railing? : A Little 6 Click Score: 22    End of Session Equipment Utilized During Treatment: Gait  belt Activity Tolerance: Patient tolerated treatment well Patient left: in chair;with call bell/phone within reach;with family/visitor present Nurse Communication: Mobility status PT Visit Diagnosis: Other abnormalities of gait and mobility (R26.89);Difficulty in walking, not elsewhere classified (R26.2);Muscle weakness (generalized) (M62.81)     Time: 5051-8335 PT Time Calculation (min) (ACUTE ONLY): 29 min  Charges:  $Gait Training: 23-37 mins                     Earney Navy, PTA Acute Rehabilitation Services Pager: [phone] Office: [phone]     Darliss Cheney 04/29/2018, 3:49 PM

## 2018-04-29 NOTE — Plan of Care (Signed)

## 2018-04-29 NOTE — Progress Notes (Signed)
Physical Therapy Treatment Patient Details Name: Brett Fowler MRN: [id number] DOB: 15-Dec-1960 Today's Date: 04/29/2018    History of Present Illness Patient is 58 y/o male s/p L THA. PMH includes depression and avascular necrosis.     PT Comments    Patient seen for mobility progression. Pt tolerated gait training well. Plan for stairs next session.    Follow Up Recommendations  Follow surgeon's recommendation for DC plan and follow-up therapies     Equipment Recommendations  Rolling walker with 5" wheels;3in1 (PT)    Recommendations for Other Services       Precautions / Restrictions Precautions Precautions: None Restrictions Weight Bearing Restrictions: Yes LLE Weight Bearing: Weight bearing as tolerated    Mobility  Bed Mobility Overal bed mobility: Modified Independent Bed Mobility: Supine to Sit           General bed mobility comments: use of rail and HOB slightly elevated  Transfers Overall transfer level: Needs assistance Equipment used: Rolling walker (2 wheeled) Transfers: Sit to/from Stand Sit to Stand: Min guard         General transfer comment: cues for safe hand placement  Ambulation/Gait Ambulation/Gait assistance: Min guard;Supervision Gait Distance (Feet): 200 Feet Assistive device: Rolling walker (2 wheeled) Gait Pattern/deviations: Step-to pattern;Step-through pattern;Decreased stance time - left;Decreased step length - right;Decreased weight shift to left Gait velocity: decreased   General Gait Details: cues for posture, sequencing, increased stride length and cadence; pt with improving step through pattern with distance    Stairs             Wheelchair Mobility    Modified Rankin (Stroke Patients Only)       Balance Overall balance assessment: Needs assistance Sitting-balance support: No upper extremity supported;Feet supported Sitting balance-Leahy Scale: Good     Standing balance support: Bilateral upper  extremity supported;During functional activity Standing balance-Leahy Scale: Poor                              Cognition Arousal/Alertness: Awake/alert Behavior During Therapy: WFL for tasks assessed/performed Overall Cognitive Status: Within Functional Limits for tasks assessed                                        Exercises      General Comments        Pertinent Vitals/Pain Pain Assessment: 0-10 Pain Score: 4  Pain Location: L LE Pain Descriptors / Indicators: Sore Pain Intervention(s): Limited activity within patient's tolerance;Monitored during session;Premedicated before session;Repositioned    Home Living                      Prior Function            PT Goals (current goals can now be found in the care plan section) Progress towards PT goals: Progressing toward goals    Frequency    7X/week      PT Plan Current plan remains appropriate    Co-evaluation              AM-PAC PT "6 Clicks" Mobility   Outcome Measure  Help needed turning from your back to your side while in a flat bed without using bedrails?: None Help needed moving from lying on your back to sitting on the side of a flat bed without using bedrails?: None Help needed  moving to and from a bed to a chair (including a wheelchair)?: A Little Help needed standing up from a chair using your arms (e.g., wheelchair or bedside chair)?: A Little Help needed to walk in hospital room?: A Little Help needed climbing 3-5 steps with a railing? : A Little 6 Click Score: 20    End of Session Equipment Utilized During Treatment: Gait belt Activity Tolerance: Patient tolerated treatment well Patient left: in chair;with call bell/phone within reach Nurse Communication: Mobility status PT Visit Diagnosis: Other abnormalities of gait and mobility (R26.89);Difficulty in walking, not elsewhere classified (R26.2);Muscle weakness (generalized) (M62.81)     Time:  6606-0045 PT Time Calculation (min) (ACUTE ONLY): 35 min  Charges:  $Gait Training: 23-37 mins                     Earney Navy, PTA Acute Rehabilitation Services Pager: [phone] Office: [phone]     Darliss Cheney 04/29/2018, 1:51 PM

## 2018-04-29 NOTE — Progress Notes (Addendum)
CSW acknowledges SNF consult, patient more appropriate for home.Marland Kitchen Appears patient is doing well with PT.   [place], [place]

## 2018-04-29 NOTE — Progress Notes (Signed)
Mepilex dressing to L hip changed per Lorin Mercy, MD. Pt tolerated well. Will continue to monitor.

## 2018-04-29 NOTE — Progress Notes (Signed)
   Subjective: 1 Day Post-Op Procedure(s) (LRB): LEFT TOTAL HIP ARTHROPLASTY DIRECT ANTERIOR (Left) Patient reports pain as mild to moderate.   Objective: Vital signs in last 24 hours: Temp:  [96.8 F (36 C)-98.6 F (37 C)] 98.4 F (36.9 C) (03/12 0526) Pulse Rate:  [51-88] 76 (03/12 0526) Resp:  [10-20] 15 (03/12 0526) BP: (111-135)/(62-92) 116/72 (03/12 0526) SpO2:  [98 %-100 %] 98 % (03/12 0526) Weight:  [85 kg] 85 kg (03/11 1050)  Intake/Output from previous day: 03/11 0701 - 03/12 0700 In: 1850 [P.O.:600; I.V.:1000; IV Piggyback:250] Out: 1200 [Urine:700; Blood:500] Intake/Output this shift: No intake/output data recorded.  Recent Labs    04/29/18 0317  HGB 12.0*   Recent Labs    04/29/18 0317  WBC 6.2  RBC 3.39*  HCT 34.9*  PLT 177   Recent Labs    04/28/18 1103 04/29/18 0317  NA 139 139  K 4.0 4.3  CL 107 105  CO2 24 27  BUN 14 17  CREATININE 0.70 0.77  GLUCOSE 106* 161*  CALCIUM 9.3 8.5*   No results for input(s): LABPT, INR in the last 72 hours.  Neurologically intact Dg C-arm 1-60 Min  Result Date: 04/28/2018 CLINICAL DATA:  Left total hip arthroplasty EXAM: DG C-ARM 61-120 MIN; OPERATIVE LEFT HIP WITH PELVIS COMPARISON:  Left hip radiographs dated 04/23/2016 FLUOROSCOPY TIME:  21 seconds FINDINGS: Intraoperative fluoroscopic radiographs during left total hip arthroplasty. Satisfactory position. No fracture or dislocation is seen. IMPRESSION: Left total hip arthroplasty in satisfactory position. Electronically Signed   By: Julian Hy M.D.   On: 04/28/2018 18:08   Dg Hip Port Unilat With Pelvis 1v Left  Result Date: 04/28/2018 CLINICAL DATA:  Postop hip. EXAM: DG HIP (WITH OR WITHOUT PELVIS) 1V PORT LEFT COMPARISON:  Fluoroscopy 04/28/2018 FINDINGS: Left total hip arthroplasty using non cemented components. Components appear well seated. No evidence of acute fracture or dislocation. Skin clips and soft tissue gas or consistent with recent  surgery. IMPRESSION: Left total hip arthroplasty. Components appear well seated. Electronically Signed   By: Lucienne Capers M.D.   On: 04/28/2018 19:18   Dg Hip Operative Unilat W Or W/o Pelvis Left  Result Date: 04/28/2018 CLINICAL DATA:  Left total hip arthroplasty EXAM: DG C-ARM 61-120 MIN; OPERATIVE LEFT HIP WITH PELVIS COMPARISON:  Left hip radiographs dated 04/23/2016 FLUOROSCOPY TIME:  21 seconds FINDINGS: Intraoperative fluoroscopic radiographs during left total hip arthroplasty. Satisfactory position. No fracture or dislocation is seen. IMPRESSION: Left total hip arthroplasty in satisfactory position. Electronically Signed   By: Julian Hy M.D.   On: 04/28/2018 18:08    Assessment/Plan: 1 Day Post-Op Procedure(s) (LRB): LEFT TOTAL HIP ARTHROPLASTY DIRECT ANTERIOR (Left) Up with therapy  Brett Fowler 04/29/2018, 8:49 AM

## 2018-04-30 LAB — CBC
HCT: 32.5 % — ABNORMAL LOW (ref 39.0–52.0)
Hemoglobin: 11.3 g/dL — ABNORMAL LOW (ref 13.0–17.0)
MCH: 35.8 pg — ABNORMAL HIGH (ref 26.0–34.0)
MCHC: 34.8 g/dL (ref 30.0–36.0)
MCV: 102.8 fL — ABNORMAL HIGH (ref 80.0–100.0)
Platelets: 159 10*3/uL (ref 150–400)
RBC: 3.16 MIL/uL — ABNORMAL LOW (ref 4.22–5.81)
RDW: 12.5 % (ref 11.5–15.5)
WBC: 7.4 10*3/uL (ref 4.0–10.5)
nRBC: 0 % (ref 0.0–0.2)

## 2018-04-30 MED ORDER — ASPIRIN 325 MG PO TABS: 325.0000 mg | ORAL_TABLET | Freq: Every day | ORAL | Status: DC

## 2018-04-30 MED ORDER — OXYCODONE-ACETAMINOPHEN 5-325 MG PO TABS: 1.0000 | ORAL_TABLET | ORAL | 0 refills | Status: AC | PRN

## 2018-04-30 MED ORDER — METHOCARBAMOL 500 MG PO TABS: 500.0000 mg | ORAL_TABLET | Freq: Four times a day (QID) | ORAL | 1 refills | Status: DC | PRN

## 2018-04-30 NOTE — Plan of Care (Signed)

## 2018-04-30 NOTE — Progress Notes (Signed)
Provided discharge education/instructions, all questions and concerns addressed, Pt not in distress, discharged home with belongings accompanied by wife. 

## 2018-04-30 NOTE — Discharge Instructions (Signed)

## 2018-04-30 NOTE — Progress Notes (Signed)
Occupational Therapy Evaluation Patient Details Name: Brett Fowler MRN: [id number] DOB: 1960-11-29 Today's Date: 04/30/2018    History of Present Illness Patient is 58 y/o male s/p L THA. PMH includes depression and avascular necrosis.    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 Pt currently limited in functional tasks due to pain, ROM limitations, and limited function. Pt engaged in toileting, grooming at the sink, and dressing with the use of AE. Pt educated on home safety, transfers, and caregiver education to prepare for transition to home setting. Pt does not require further OT. DC recommendation to home with 24 hour support. OT will sign off. Thank you the recommendation.    Follow Up Recommendations  No OT follow up    Equipment Recommendations       Recommendations for Other Services       Precautions / Restrictions Precautions Precautions: None Restrictions Weight Bearing Restrictions: Yes LLE Weight Bearing: Weight bearing as tolerated      Mobility Bed Mobility Overal bed mobility: Modified Independent             General bed mobility comments: pt received in chair upon arrival.  Transfers Overall transfer level: Needs assistance Equipment used: Rolling walker (2 wheeled) Transfers: Sit to/from Stand Sit to Stand: Supervision         General transfer comment: for safety    Balance Overall balance assessment: Needs assistance Sitting-balance support: No upper extremity supported;Feet supported Sitting balance-Leahy Scale: Good     Standing balance support: Bilateral upper extremity supported;During functional activity Standing balance-Leahy Scale: Fair Standing balance comment: pt is able to static stand without UE support                            ADL either performed or assessed with clinical judgement   ADL Overall ADL's : Needs assistance/impaired     Grooming:  Modified independent;Wash/dry hands;Wash/dry face;Oral care;Applying deodorant;Standing Grooming Details (indicate cue type and reason): required additional time while standing at sink with RW.     Lower Body Bathing: Minimal assistance   Upper Body Dressing : Independent   Lower Body Dressing: Minimal assistance Lower Body Dressing Details (indicate cue type and reason): Pt required assist to initiate don of LB dressing with LLE. Toilet Transfer: Designer, fashion/clothing and Hygiene: Modified independent       Functional mobility during ADLs: Min guard General ADL Comments: Min guard for safety. Pt able to make correction for safe transfer and transition with AE.     Vision         Perception     Praxis      Pertinent Vitals/Pain Pain Assessment: 0-10 Pain Score: 4  Faces Pain Scale: Hurts a little bit Pain Location: L LE Pain Descriptors / Indicators: Sore Pain Intervention(s): Monitored during session;Repositioned     Hand Dominance Right   Extremity/Trunk Assessment Upper Extremity Assessment Upper Extremity Assessment: Overall WFL for tasks assessed   Lower Extremity Assessment Lower Extremity Assessment: Defer to PT evaluation LLE Deficits / Details: LLE deficits consistent with post op pain and weakness LLE Sensation: decreased light touch   Cervical / Trunk Assessment Cervical / Trunk Assessment: Normal   Communication Communication Communication: No difficulties   Cognition Arousal/Alertness: Awake/alert Behavior During Therapy: WFL for tasks assessed/performed Overall Cognitive Status: Within Functional Limits for tasks assessed  General Comments  pt wife in room during session.    Exercises Exercises: Total Joint Total Joint Exercises Short Arc Quad: AROM;Strengthening;Left Hip ABduction/ADduction: AROM;Strengthening;Left;Seated;Standing Long Arc Quad:  AROM;Strengthening;Left;Seated Knee Flexion: AROM;Left;Standing Marching in Standing: AROM;Left;Standing Standing Hip Extension: AROM;Left;Standing   Shoulder Instructions      Home Living Family/patient expects to be discharged to:: Private residence Living Arrangements: Spouse/significant other Available Help at Discharge: Family;Available 24 hours/day Type of Home: House Home Access: Stairs to enter [employer] of Steps: 3 Entrance Stairs-Rails: Right;Left;Can reach both Home Layout: One level     Bathroom Shower/Tub: Teacher, early years/pre: Standard     Home Equipment: None   Additional Comments: Bathtub is a garden tub with wide threshold. Unable to simulate due to inaccurate tub in therapy gym. Pt instructed to bath at sink for safety until recovered.      Prior Functioning/Environment Level of Independence: Independent                 OT Problem List: Decreased activity tolerance;Pain;Decreased knowledge of use of DME or AE;Decreased knowledge of precautions      OT Treatment/Interventions:      OT Goals(Current goals can be found in the care plan section) Acute Rehab OT Goals Patient Stated Goal: "get back to walking" OT Goal Formulation: With patient Time For Goal Achievement: 05/14/18 Potential to Achieve Goals: Good  OT Frequency:     Barriers to D/C:            Co-evaluation              AM-PAC OT "6 Clicks" Daily Activity     Outcome Measure Help from another person eating meals?: None Help from another person taking care of personal grooming?: None Help from another person toileting, which includes using toliet, bedpan, or urinal?: A Little Help from another person bathing (including washing, rinsing, drying)?: A Little Help from another person to put on and taking off regular upper body clothing?: None Help from another person to put on and taking off regular lower body clothing?: A Little 6 Click Score: 21    End of Session Equipment Utilized During Treatment: Gait belt;Rolling walker Nurse Communication: Mobility status;Weight bearing status  Activity Tolerance: Patient tolerated treatment well Patient left: in chair;with call bell/phone within reach;with family/visitor present  OT Visit Diagnosis: Unsteadiness on feet (R26.81);Pain Pain - Right/Left: Left Pain - part of body: Hip                Time: 1129-1212 OT Time Calculation (min): 43 min Charges:  OT General Charges $OT Visit: 1 Visit OT Evaluation $OT Eval Moderate Complexity: 1 Mod OT Treatments $Self Care/Home Management : 38-52 mins  Minus Breeding, MSOT, OTR/L  Supplemental Rehabilitation Services  [phone]  Marius Ditch 04/30/2018, 1:44 PM

## 2018-04-30 NOTE — Anesthesia Postprocedure Evaluation (Signed)
Anesthesia Post Note  Patient: Brett Fowler  Procedure(s) Performed: LEFT TOTAL HIP ARTHROPLASTY DIRECT ANTERIOR (Left Hip)     Patient location during evaluation: PACU Anesthesia Type: Spinal Level of consciousness: oriented and awake and alert Pain management: pain level controlled Vital Signs Assessment: post-procedure vital signs reviewed and stable Respiratory status: spontaneous breathing, respiratory function stable and patient connected to nasal cannula oxygen Cardiovascular status: blood pressure returned to baseline and stable Postop Assessment: no headache, no backache and no apparent nausea or vomiting Anesthetic complications: no    Last Vitals:  Vitals:   04/29/18 2028 04/30/18 0536  BP: 121/77 111/72  Pulse: 83 78  Resp: 19 20  Temp: 37.1 C 36.9 C  SpO2: 99% 98%    Last Pain:  Vitals:   04/30/18 1000  TempSrc:   PainSc: 3                  Ailine Hefferan

## 2018-04-30 NOTE — Care Management (Signed)
Per Dr. Lorin Mercy patient will not need Home Health PT., only DME.    Ricki Miller, RN BSN  Case Manager [phone]

## 2018-04-30 NOTE — Progress Notes (Signed)
Physical Therapy Treatment Patient Details Name: Brett Fowler MRN: [id number] DOB: 08-23-1960 Today's Date: 04/30/2018    History of Present Illness Patient is 58 y/o male s/p L THA. PMH includes depression and avascular necrosis.     PT Comments    Patient is making good progress with PT.  From a mobility standpoint anticipate patient will be ready for DC home when medically ready.    Follow Up Recommendations  Follow surgeon's recommendation for DC plan and follow-up therapies     Equipment Recommendations  Rolling walker with 5" wheels;3in1 (PT)    Recommendations for Other Services       Precautions / Restrictions Precautions Precautions: None Restrictions Weight Bearing Restrictions: Yes LLE Weight Bearing: Weight bearing as tolerated    Mobility  Bed Mobility Overal bed mobility: Modified Independent                Transfers Overall transfer level: Modified independent Equipment used: Rolling walker (2 wheeled) Transfers: Sit to/from Stand              Ambulation/Gait Ambulation/Gait assistance: Supervision Gait Distance (Feet): 120 Feet Assistive device: Rolling walker (2 wheeled) Gait Pattern/deviations: Step-through pattern;Decreased weight shift to left Gait velocity: decreased   General Gait Details: steady gait, decreased cadence; improving L LE weight bearing   Stairs             Wheelchair Mobility    Modified Rankin (Stroke Patients Only)       Balance Overall balance assessment: Needs assistance Sitting-balance support: No upper extremity supported;Feet supported Sitting balance-Leahy Scale: Good       Standing balance-Leahy Scale: Fair Standing balance comment: pt is able to static stand without UE support                             Cognition Arousal/Alertness: Awake/alert Behavior During Therapy: WFL for tasks assessed/performed Overall Cognitive Status: Within Functional Limits for tasks  assessed                                        Exercises Total Joint Exercises Short Arc Quad: AROM;Strengthening;Left Hip ABduction/ADduction: AROM;Strengthening;Left;Seated;Standing Long Arc Quad: AROM;Strengthening;Left;Seated Knee Flexion: AROM;Left;Standing Marching in Standing: AROM;Left;Standing Standing Hip Extension: AROM;Left;Standing    General Comments        Pertinent Vitals/Pain Pain Assessment: Faces Faces Pain Scale: Hurts a little bit Pain Location: L LE Pain Descriptors / Indicators: Sore Pain Intervention(s): Limited activity within patient's tolerance;Monitored during session;Repositioned    Home Living                      Prior Function            PT Goals (current goals can now be found in the care plan section) Progress towards PT goals: Progressing toward goals    Frequency    7X/week      PT Plan Current plan remains appropriate    Co-evaluation              AM-PAC PT "6 Clicks" Mobility   Outcome Measure  Help needed turning from your back to your side while in a flat bed without using bedrails?: None Help needed moving from lying on your back to sitting on the side of a flat bed without using bedrails?: None Help needed moving to and from  a bed to a chair (including a wheelchair)?: None Help needed standing up from a chair using your arms (e.g., wheelchair or bedside chair)?: None Help needed to walk in hospital room?: None Help needed climbing 3-5 steps with a railing? : A Little 6 Click Score: 23    End of Session Equipment Utilized During Treatment: Gait belt Activity Tolerance: Patient tolerated treatment well Patient left: in chair;with call bell/phone within reach Nurse Communication: Mobility status PT Visit Diagnosis: Other abnormalities of gait and mobility (R26.89);Difficulty in walking, not elsewhere classified (R26.2);Muscle weakness (generalized) (M62.81)     Time: 4076-8088 PT  Time Calculation (min) (ACUTE ONLY): 27 min  Charges:  $Gait Training: 8-22 mins $Therapeutic Exercise: 8-22 mins                     Earney Navy, PTA Acute Rehabilitation Services Pager: [phone] Office: [phone]     Darliss Cheney 04/30/2018, 10:50 AM

## 2018-04-30 NOTE — Progress Notes (Signed)
   Subjective: 2 Days Post-Op Procedure(s) (LRB): LEFT TOTAL HIP ARTHROPLASTY DIRECT ANTERIOR (Left) Patient reports pain as mild.  " I'm ready to go home , going up and down the halls"  Objective: Vital signs in last 24 hours: Temp:  [98.4 F (36.9 C)-98.8 F (37.1 C)] 98.5 F (36.9 C) (03/13 0536) Pulse Rate:  [75-83] 78 (03/13 0536) Resp:  [16-20] 20 (03/13 0536) BP: (111-128)/(72-77) 111/72 (03/13 0536) SpO2:  [97 %-99 %] 98 % (03/13 0536)  Intake/Output from previous day: 03/12 0701 - 03/13 0700 In: 720 [P.O.:720] Out: 1700 [Urine:1700] Intake/Output this shift: No intake/output data recorded.  Recent Labs    04/29/18 0317 04/30/18 0305  HGB 12.0* 11.3*   Recent Labs    04/29/18 0317 04/30/18 0305  WBC 6.2 7.4  RBC 3.39* 3.16*  HCT 34.9* 32.5*  PLT 177 159   Recent Labs    04/28/18 1103 04/29/18 0317  NA 139 139  K 4.0 4.3  CL 107 105  CO2 24 27  BUN 14 17  CREATININE 0.70 0.77  GLUCOSE 106* 161*  CALCIUM 9.3 8.5*   No results for input(s): LABPT, INR in the last 72 hours.  Neurologically intact No results found.  Assessment/Plan: 2 Days Post-Op Procedure(s) (LRB): LEFT TOTAL HIP ARTHROPLASTY DIRECT ANTERIOR (Left) Plan:  Discharge home.   Brett Fowler 04/30/2018, 9:51 AM

## 2018-05-03 ENCOUNTER — Encounter (INDEPENDENT_AMBULATORY_CARE_PROVIDER_SITE_OTHER): Payer: Self-pay | Admitting: Orthopaedic Surgery

## 2018-05-03 ENCOUNTER — Ambulatory Visit (INDEPENDENT_AMBULATORY_CARE_PROVIDER_SITE_OTHER): Payer: BLUE CROSS/BLUE SHIELD | Admitting: Orthopaedic Surgery

## 2018-05-03 ENCOUNTER — Other Ambulatory Visit: Payer: Self-pay

## 2018-05-03 ENCOUNTER — Telehealth (INDEPENDENT_AMBULATORY_CARE_PROVIDER_SITE_OTHER): Payer: Self-pay

## 2018-05-03 VITALS — BP 128/83 | HR 71 | Ht 67.0 in | Wt 187.0 lb

## 2018-05-03 DIAGNOSIS — Z96642 Presence of left artificial hip joint: Secondary | ICD-10-CM | POA: Insufficient documentation

## 2018-05-03 NOTE — Telephone Encounter (Signed)
Patient called Triage line  And states  He is having drainage. He will come in this morning for wound check and to apply new dressing.

## 2018-05-03 NOTE — Progress Notes (Signed)
   Post-Op Visit Note   Patient: Brett Fowler           Date of Birth: 05-Sep-1960           MRN: [id number] Visit Date: 05/03/2018 PCP: Aleen Campi, NP   Assessment & Plan: Post left total hip arthroplasty for avascular necrosis.  He has had slight serous drainage and has some ecchymosis posteriorly in relation to the incision.  He is using his walker has minimal pain only taking 1 pain tablet a day.  Chief Complaint:  Chief Complaint  Patient presents with  . Left Hip - Wound Check    04/28/2018  Left THA   Visit Diagnoses:  1. H/O total hip arthroplasty, left     Plan: ROV one week for staples.  Patient's had some drainage from his left hip.  Betadine applied Mediplex dressing applied.  He will try to cut back on his walking some until his hip stops the drainage.  No cellulitis.  Recheck 1 week  Follow-Up Instructions: Return in about 1 week (around 05/10/2018).   Orders:  No orders of the defined types were placed in this encounter.  No orders of the defined types were placed in this encounter.   Imaging: No results found.  PMFS History: Patient Active Problem List   Diagnosis Date Noted  . Arthritis of left hip 04/28/2018  . Avascular necrosis of bones of both hips ([place]) 09/22/2016   Past Medical History:  Diagnosis Date  . Acid reflux   . Arthritis   . Avascular necrosis of bone of hip, left ([place])   . Depression   . Ulcerative colitis ([place])     No family history on file.  Past Surgical History:  Procedure Laterality Date  . COLONOSCOPY    . HERNIA REPAIR  2004  . TOTAL HIP ARTHROPLASTY Left 04/28/2018   Procedure: LEFT TOTAL HIP ARTHROPLASTY DIRECT ANTERIOR;  Surgeon: [name], MD;  Location: [facility];  Service: Orthopedics;  Laterality: Left;  . WISDOM TOOTH EXTRACTION     Social History   Occupational History  . Occupation: Wellsite geologist  Tobacco Use  . Smoking status: Never Smoker  . Smokeless tobacco: Never Used  Substance and Sexual  Activity  . Alcohol use: Yes    Alcohol/week: 30.0 standard drinks    Types: 30 Cans of beer per week    Comment:  4-5 days a week  . Drug use: No  . Sexual activity: Not on file

## 2018-05-07 NOTE — Discharge Summary (Signed)
Patient ID: Brett Fowler MRN: [id number] DOB/AGE: 1960/04/10 58 y.o.  Admit date: 04/28/2018 Discharge date: 05/07/2018  Admission Diagnoses:  Active Problems:   Avascular necrosis of bones of both hips ([facility])   Arthritis of left hip   Discharge Diagnoses:  Active Problems:   Avascular necrosis of bones of both hips ([facility])   Arthritis of left hip  status post Procedure(s): LEFT TOTAL HIP ARTHROPLASTY DIRECT ANTERIOR  Past Medical History:  Diagnosis Date  . Acid reflux   . Arthritis   . Avascular necrosis of bone of hip, left ([place])   . Depression   . Ulcerative colitis ([place])     Surgeries: Procedure(s): LEFT TOTAL HIP ARTHROPLASTY DIRECT ANTERIOR on 04/28/2018   Consultants:   Discharged Condition: Improved  Hospital Course: Brett Fowler is an 58 y.o. male who was admitted 04/28/2018 for operative treatment of left hip AVN/DJD. Patient failed conservative treatments (please see the history and physical for the specifics) and had severe unremitting pain that affects sleep, daily activities and work/hobbies. After pre-op clearance, the patient was taken to the operating room on 04/28/2018 and underwent  Procedure(s): LEFT TOTAL HIP ARTHROPLASTY DIRECT ANTERIOR.    Patient was given perioperative antibiotics:  Anti-infectives (From admission, onward)   Start     Dose/Rate Route Frequency Ordered Stop   04/28/18 2200  ceFAZolin (ANCEF) IVPB 1 g/50 mL premix     1 g 100 mL/hr over 30 Minutes Intravenous Every 8 hours 04/28/18 1745 04/29/18 0624   04/28/18 1032  ceFAZolin (ANCEF) 2-4 GM/100ML-% IVPB    Note to Pharmacy:  Tamsen Snider   : cabinet override      04/28/18 1032 04/28/18 1344   04/28/18 1030  ceFAZolin (ANCEF) IVPB 2g/100 mL premix     2 g 200 mL/hr over 30 Minutes Intravenous On call to O.R. 04/28/18 1026 04/28/18 1344       Patient was given sequential compression devices and early ambulation to prevent DVT.   Patient benefited maximally from hospital  stay and there were no complications. At the time of discharge, the patient was urinating/moving their bowels without difficulty, tolerating a regular diet, pain is controlled with oral pain medications and they have been cleared by PT/OT.   Recent vital signs: No data found.   Recent laboratory studies: No results for input(s): WBC, HGB, HCT, PLT, NA, K, CL, CO2, BUN, CREATININE, GLUCOSE, INR, CALCIUM in the last 72 hours.  Invalid input(s): PT, 2   Discharge Medications:   Allergies as of 04/30/2018   No Known Allergies     Medication List    STOP taking these medications   doxycycline 100 MG capsule Commonly known as:  VIBRAMYCIN   GLUCOSAMINE-CHONDROIT-MSM-C-MN PO   ibuprofen 200 MG tablet Commonly known as:  ADVIL,MOTRIN     TAKE these medications   aspirin 325 MG tablet Commonly known as:  Bayer Aspirin Take 1 tablet (325 mg total) by mouth daily.   escitalopram 20 MG tablet Commonly known as:  LEXAPRO Take 20 mg by mouth every evening.   Garlic 7106 MG Caps Take 1,000 mg by mouth daily.   mercaptopurine 50 MG tablet Commonly known as:  PURINETHOL Take 50 mg by mouth daily.   mesalamine 1.2 g EC tablet Commonly known as:  LIALDA Take 2.4 g by mouth daily with breakfast.   methocarbamol 500 MG tablet Commonly known as:  ROBAXIN Take 1 tablet (500 mg total) by mouth every 6 (six) hours as needed for  muscle spasms.   multivitamin capsule Take 1 capsule by mouth daily.   oxyCODONE-acetaminophen 5-325 MG tablet Commonly known as:  Percocet Take 1-2 tablets by mouth every 4 (four) hours as needed for severe pain.   Turmeric 500 MG Tabs Take 500 mg by mouth daily.   vitamin C 500 MG tablet Commonly known as:  ASCORBIC ACID Take 500 mg by mouth daily.       Diagnostic Studies: Dg Chest 2 View  Result Date: 04/19/2018 CLINICAL DATA:  Preop study prior to hip surgery. EXAM: CHEST - 2 VIEW COMPARISON:  None. FINDINGS: The heart size and mediastinal  contours are within normal limits. Both lungs are clear. The visualized skeletal structures are unremarkable. IMPRESSION: No active cardiopulmonary disease. Electronically Signed   By: Dorise Bullion III M.D   On: 04/19/2018 14:55   Dg C-arm 1-60 Min  Result Date: 04/28/2018 CLINICAL DATA:  Left total hip arthroplasty EXAM: DG C-ARM 61-120 MIN; OPERATIVE LEFT HIP WITH PELVIS COMPARISON:  Left hip radiographs dated 04/23/2016 FLUOROSCOPY TIME:  21 seconds FINDINGS: Intraoperative fluoroscopic radiographs during left total hip arthroplasty. Satisfactory position. No fracture or dislocation is seen. IMPRESSION: Left total hip arthroplasty in satisfactory position. Electronically Signed   By: Julian Hy M.D.   On: 04/28/2018 18:08   Dg Hip Port Unilat With Pelvis 1v Left  Result Date: 04/28/2018 CLINICAL DATA:  Postop hip. EXAM: DG HIP (WITH OR WITHOUT PELVIS) 1V PORT LEFT COMPARISON:  Fluoroscopy 04/28/2018 FINDINGS: Left total hip arthroplasty using non cemented components. Components appear well seated. No evidence of acute fracture or dislocation. Skin clips and soft tissue gas or consistent with recent surgery. IMPRESSION: Left total hip arthroplasty. Components appear well seated. Electronically Signed   By: Lucienne Capers M.D.   On: 04/28/2018 19:18   Dg Hip Operative Unilat W Or W/o Pelvis Left  Result Date: 04/28/2018 CLINICAL DATA:  Left total hip arthroplasty EXAM: DG C-ARM 61-120 MIN; OPERATIVE LEFT HIP WITH PELVIS COMPARISON:  Left hip radiographs dated 04/23/2016 FLUOROSCOPY TIME:  21 seconds FINDINGS: Intraoperative fluoroscopic radiographs during left total hip arthroplasty. Satisfactory position. No fracture or dislocation is seen. IMPRESSION: Left total hip arthroplasty in satisfactory position. Electronically Signed   By: Julian Hy M.D.   On: 04/28/2018 18:08      Follow-up Information    [name], MD Follow up in 1 week(s).   Specialty:  Orthopedic  Surgery Contact information: [place] [address] [phone]           Discharge Plan:  discharge to home  Disposition:     Signed: Benjiman Core for Rodell Perna MD 05/07/2018, 11:08 AM

## 2018-05-11 ENCOUNTER — Telehealth (INDEPENDENT_AMBULATORY_CARE_PROVIDER_SITE_OTHER): Payer: Self-pay | Admitting: Radiology

## 2018-05-11 NOTE — Telephone Encounter (Signed)
I have called patient and left message to return call about their upcoming appointment. Please ask these questions in pre-screening patient. Thank you!  Do you have now or have you had in the past 7 days a fever and/or chills? Do you have now or have you had in the past 7 days a cough? Do you have now or have you had in the last 7 days nausea, vomiting or abdominal pain? Have you been exposed to anyone who has tested positive for COVID-19? Have you or anyone who lives with you traveled within the last month?

## 2018-05-12 ENCOUNTER — Ambulatory Visit (INDEPENDENT_AMBULATORY_CARE_PROVIDER_SITE_OTHER): Payer: BLUE CROSS/BLUE SHIELD

## 2018-05-12 ENCOUNTER — Other Ambulatory Visit: Payer: Self-pay

## 2018-05-12 ENCOUNTER — Ambulatory Visit (INDEPENDENT_AMBULATORY_CARE_PROVIDER_SITE_OTHER): Payer: BLUE CROSS/BLUE SHIELD | Admitting: Orthopaedic Surgery

## 2018-05-12 DIAGNOSIS — Z96642 Presence of left artificial hip joint: Secondary | ICD-10-CM

## 2018-05-12 NOTE — Progress Notes (Signed)
   Post-Op Visit Note   Patient: GOODWIN KAMPHAUS           Date of Birth: February 16, 1961           MRN: [id number] Visit Date: 05/12/2018 PCP: Aleen Campi, NP   Assessment & Plan: Post left total hip arthroplasty.  Staples are removed incision looks good he is off his pain medication he can wean from a walker to a cane which was provided and use was discussed.  Return 5 weeks.  Work slip given for no work x1 month.  He is happy with the surgical result.  Chief Complaint:  Chief Complaint  Patient presents with  . Left Hip - Routine Post Op    04/28/2018 Left THA   Visit Diagnoses:  1. H/O total hip arthroplasty, left     Plan: Recheck 5 weeks.  X-ray showed good position alignment leg lengths are equal staples are harvested.  He is off his pain medication and can use plain Tylenol and will wean to a cane and then off the cane.  He will increase his walking daily.  Follow-Up Instructions: Return in about 5 weeks (around 06/16/2018).   Orders:  Orders Placed This Encounter  Procedures  . XR HIP UNILAT W OR W/O PELVIS 2-3 VIEWS LEFT   No orders of the defined types were placed in this encounter.   Imaging: Xr Hip Unilat W Or W/o Pelvis 2-3 Views Left  Result Date: 05/12/2018 AP pelvis frog-leg left hip obtained and reviewed.  This shows well-positioned total hip arthroplasty without complications.  Leg lengths are equal. Impression: Satisfactory left total hip arthroplasty.  Unchanged right hip AVN.   PMFS History: Patient Active Problem List   Diagnosis Date Noted  . H/O total hip arthroplasty, left 05/03/2018  . Arthritis of left hip 04/28/2018  . Avascular necrosis of bones of both hips ([place]) 09/22/2016   Past Medical History:  Diagnosis Date  . Acid reflux   . Arthritis   . Avascular necrosis of bone of hip, left ([place])   . Depression   . Ulcerative colitis ([place])     No family history on file.  Past Surgical History:  Procedure Laterality Date  . COLONOSCOPY    .  HERNIA REPAIR  2004  . TOTAL HIP ARTHROPLASTY Left 04/28/2018   Procedure: LEFT TOTAL HIP ARTHROPLASTY DIRECT ANTERIOR;  Surgeon: [name], MD;  Location: [facility];  Service: Orthopedics;  Laterality: Left;  . WISDOM TOOTH EXTRACTION     Social History   Occupational History  . Occupation: Wellsite geologist  Tobacco Use  . Smoking status: Never Smoker  . Smokeless tobacco: Never Used  Substance and Sexual Activity  . Alcohol use: Yes    Alcohol/week: 30.0 standard drinks    Types: 30 Cans of beer per week    Comment:  4-5 days a week  . Drug use: No  . Sexual activity: Not on file

## 2018-05-14 ENCOUNTER — Ambulatory Visit (INDEPENDENT_AMBULATORY_CARE_PROVIDER_SITE_OTHER): Payer: BLUE CROSS/BLUE SHIELD | Admitting: Orthopaedic Surgery

## 2018-06-16 ENCOUNTER — Encounter (INDEPENDENT_AMBULATORY_CARE_PROVIDER_SITE_OTHER): Payer: Self-pay | Admitting: Orthopaedic Surgery

## 2018-06-16 ENCOUNTER — Other Ambulatory Visit: Payer: Self-pay

## 2018-06-16 ENCOUNTER — Ambulatory Visit (INDEPENDENT_AMBULATORY_CARE_PROVIDER_SITE_OTHER): Payer: BLUE CROSS/BLUE SHIELD | Admitting: Orthopaedic Surgery

## 2018-06-16 VITALS — Ht 67.0 in | Wt 185.0 lb

## 2018-06-16 DIAGNOSIS — M87052 Idiopathic aseptic necrosis of left femur: Secondary | ICD-10-CM

## 2018-06-16 DIAGNOSIS — Z96642 Presence of left artificial hip joint: Secondary | ICD-10-CM

## 2018-06-16 DIAGNOSIS — M87051 Idiopathic aseptic necrosis of right femur: Secondary | ICD-10-CM

## 2018-06-16 NOTE — Progress Notes (Signed)
   Post-Op Visit Note   Patient: Brett Fowler           Date of Birth: [birth year]           MRN: [id number] Visit Date: 06/16/2018 PCP: Aleen Campi, NP   Assessment & Plan: Recheck 6 months repeat standing AP pelvis to show both hips on return.  Frog-leg right hip as well.  Chief Complaint:  Chief Complaint  Patient presents with  . Left Hip - Follow-up    04/28/2018 Left THA-Direct Anterior   Visit Diagnoses:  1. H/O total hip arthroplasty, left   2. Avascular necrosis of bones of both hips ([place])     Plan: Patient is back at work.  He still has some mild puffiness in his right hip adjacent to the incision.  Thigh lengths are equal.  He is working on progressive strengthening and is happy with the surgical result.  He has avascular necrosis of the opposite hip and can recheck with me in 6 months.  If his right hip gets severely painful he can return earlier.  Follow-Up Instructions: Return in about 6 months (around 12/16/2018).   Orders:  No orders of the defined types were placed in this encounter.  No orders of the defined types were placed in this encounter.   Imaging: No results found.  PMFS History: Patient Active Problem List   Diagnosis Date Noted  . H/O total hip arthroplasty, left 05/03/2018  . Arthritis of left hip 04/28/2018  . Avascular necrosis of bones of both hips ([place]) 09/22/2016   Past Medical History:  Diagnosis Date  . Acid reflux   . Arthritis   . Avascular necrosis of bone of hip, left ([place])   . Depression   . Ulcerative colitis ([place])     No family history on file.  Past Surgical History:  Procedure Laterality Date  . COLONOSCOPY    . HERNIA REPAIR  2004  . TOTAL HIP ARTHROPLASTY Left 04/28/2018   Procedure: LEFT TOTAL HIP ARTHROPLASTY DIRECT ANTERIOR;  Surgeon: [name], MD;  Location: [facility];  Service: Orthopedics;  Laterality: Left;  . WISDOM TOOTH EXTRACTION     Social History   Occupational History  . Occupation: Building services engineer  Tobacco Use  . Smoking status: Never Smoker  . Smokeless tobacco: Never Used  Substance and Sexual Activity  . Alcohol use: Yes    Alcohol/week: 30.0 standard drinks    Types: 30 Cans of beer per week    Comment:  4-5 days a week  . Drug use: No  . Sexual activity: Not on file

## 2018-10-05 ENCOUNTER — Telehealth: Payer: Self-pay | Admitting: Orthopaedic Surgery

## 2018-10-05 NOTE — Telephone Encounter (Signed)
Received call from [facility] with [place] office  needing note faxed stating patient do not need an antibiotic prior to his dental appointment. The fax# is [phone]   Ph# is [phone]

## 2018-10-05 NOTE — Telephone Encounter (Signed)
faxed

## 2018-12-14 ENCOUNTER — Ambulatory Visit: Payer: BLUE CROSS/BLUE SHIELD | Admitting: Orthopaedic Surgery

## 2018-12-15 ENCOUNTER — Ambulatory Visit: Payer: BLUE CROSS/BLUE SHIELD | Admitting: Surgery

## 2018-12-23 ENCOUNTER — Encounter: Payer: Self-pay | Admitting: Surgery

## 2018-12-23 ENCOUNTER — Other Ambulatory Visit: Payer: Self-pay

## 2018-12-23 ENCOUNTER — Ambulatory Visit (INDEPENDENT_AMBULATORY_CARE_PROVIDER_SITE_OTHER): Payer: BC Managed Care – PPO | Admitting: Surgery

## 2018-12-23 DIAGNOSIS — Z96642 Presence of left artificial hip joint: Secondary | ICD-10-CM | POA: Diagnosis not present

## 2018-12-23 NOTE — Progress Notes (Signed)
58 year old white male who is a month status post left total hip replaced returns.  States that he is doing well and very pleased with the surgery result of this point.  He is back to working full-time.  Complains of mild soreness occasionally over the left hip greater trochanter bursa.  Nothing that is limiting his activity.  No complaints of groin pain.  Exam Gait is normal.  Very pleasant white male alert and oriented in no acute distress.  Negative logroll bilateral hips.  He is mildly tender over the left hip greater trochanter bursa.  X-rays None obtained  Plan Advised patient that I am pleased with his progress over this point.  Follow-up in 4 months with me for recheck when he is a year out from surgery and I will get a new x-ray of his hip at that time.  If he has increasing soreness over the left hip greater trochanter bursa he will let me know and we did discuss possibly injection there.  I do not think this is necessary today.  All questions answered.

## 2019-04-21 ENCOUNTER — Encounter: Payer: Self-pay | Admitting: Surgery

## 2019-04-21 ENCOUNTER — Other Ambulatory Visit: Payer: Self-pay

## 2019-04-21 ENCOUNTER — Ambulatory Visit: Payer: BC Managed Care – PPO | Admitting: Surgery

## 2019-04-21 ENCOUNTER — Ambulatory Visit: Payer: Self-pay

## 2019-04-21 DIAGNOSIS — Z96642 Presence of left artificial hip joint: Secondary | ICD-10-CM | POA: Diagnosis not present

## 2019-04-21 NOTE — Progress Notes (Signed)
   Office Visit Note   Patient: Brett Fowler           Date of Birth: [birth year]           MRN: [id number] Visit Date: 04/21/2019              Requested by: Aleen Campi, NP [address] [place],  [place] [address] PCP: Aleen Campi, NP   Assessment & Plan: Visit Diagnoses:  1. History of total hip replacement, left     Plan: Patient is doing very well and pleased with his surgical result over this point.  He will follow-up in 1 year for recheck with repeat x-ray at that time.  Return sooner if needed.  Follow-Up Instructions: Return in about 1 year (around 04/20/2020) for for recheck and xray.   Orders:  Orders Placed This Encounter  Procedures  . XR HIP UNILAT W OR W/O PELVIS 2-3 VIEWS LEFT   No orders of the defined types were placed in this encounter.     Procedures: No procedures performed   Clinical Data: No additional findings.   Subjective: Chief Complaint  Patient presents with  . Left Hip - Pain    HPI 58 year old white male who is status post left total hip replacement April 28, 2018 returns.  States that he is doing well.  Occasionally has some soreness in his hip but overall doing well and pleased with his result.   Objective: Vital Signs: There were no vitals taken for this visit.  Physical Exam Very pleasant white male alert and oriented in no acute distress.  Gait is normal.  Negative logroll bilateral hips.  Negative straight leg raise.  Neurologically intact. Ortho Exam  Specialty Comments:  No specialty comments available.  Imaging: No results found.   PMFS History: Patient Active Problem List   Diagnosis Date Noted  . H/O total hip arthroplasty, left 05/03/2018  . Arthritis of left hip 04/28/2018  . Avascular necrosis of bones of both hips ([place]) 09/22/2016   Past Medical History:  Diagnosis Date  . Acid reflux   . Arthritis   . Avascular necrosis of bone of hip, left ([place])   . Depression   . Ulcerative colitis ([place])      History reviewed. No pertinent family history.  Past Surgical History:  Procedure Laterality Date  . COLONOSCOPY    . HERNIA REPAIR  2004  . TOTAL HIP ARTHROPLASTY Left 04/28/2018   Procedure: LEFT TOTAL HIP ARTHROPLASTY DIRECT ANTERIOR;  Surgeon: [name], MD;  Location: [facility];  Service: Orthopedics;  Laterality: Left;  . WISDOM TOOTH EXTRACTION     Social History   Occupational History  . Occupation: Wellsite geologist  Tobacco Use  . Smoking status: Never Smoker  . Smokeless tobacco: Never Used  Substance and Sexual Activity  . Alcohol use: Yes    Alcohol/week: 30.0 standard drinks    Types: 30 Cans of beer per week    Comment:  4-5 days a week  . Drug use: No  . Sexual activity: Not on file

## 2019-05-04 IMAGING — XA DG FLUORO GUIDE NDL PLC/BX
1 series · 1 of 1 positions shown · non-contrast
Comparison: none

CLINICAL DATA: Left hip pain.

[Series 1: ortho standard · 1 of 1 slices shown]
[im 1/1]
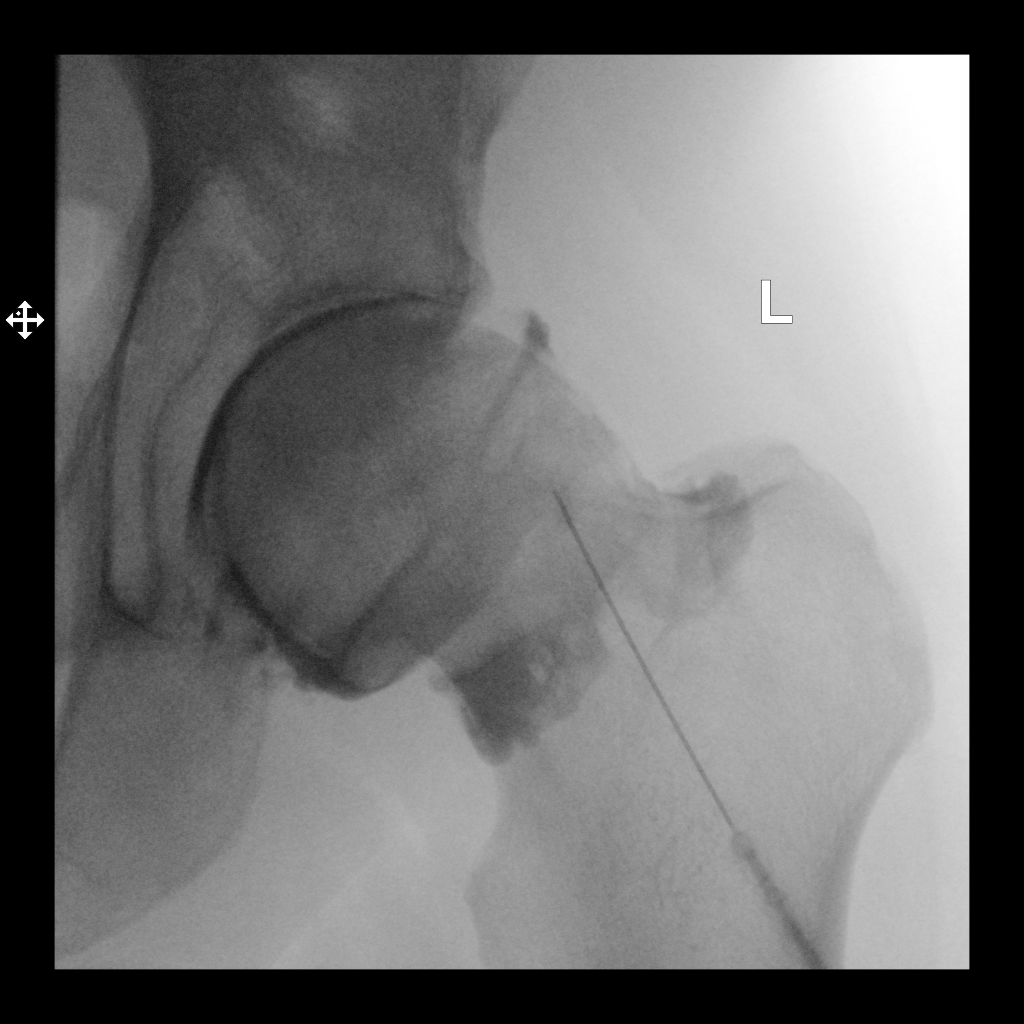

[1 of 1 positions shown; findings below may reference images not displayed]

EXAM:
LEFT HIP INJECTION FOR MRI

FLUOROSCOPY TIME:  Fluoroscopy Time:  1 second

Radiation Exposure Index (if provided by the fluoroscopic device):
23.81 microGray*m^2

Number of Acquired Spot Images: 0

PROCEDURE:
Overlying skin prepped with Betadine, draped in the usual sterile
fashion, and infiltrated locally with Lidocaine. A 3.5 inch 22 gauge
spinal needle was advanced to the lateral aspect of the left femoral
head-neck junction. 1 ml of 1% lidocaine injected easily. A mixture
of 0.05 mL of MultiHance, 5 mL of 1% lidocaine, and 15 mL of
Isovue-M 200 was then used to opacify the left femoral head. 13 mL
of this mixture were injected. No immediate complication.
IMPRESSION: Technically successful left hip injection under fluoroscopy for MR
arthrogram.

## 2019-07-22 ENCOUNTER — Ambulatory Visit
Admission: EM | Admit: 2019-07-22 | Discharge: 2019-07-22 | Disposition: A | Discharge status: Home / Self Care | Payer: BC Managed Care – PPO | Attending: Emergency Medicine | Admitting: Emergency Medicine

## 2019-07-22 DIAGNOSIS — H60501 Unspecified acute noninfective otitis externa, right ear: Secondary | ICD-10-CM

## 2019-07-22 DIAGNOSIS — H6121 Impacted cerumen, right ear: Secondary | ICD-10-CM

## 2019-07-22 MED ORDER — NEOMYCIN-POLYMYXIN-HC 3.5-10000-1 OT SOLN: 3.0000 [drp] | Freq: Three times a day (TID) | OTIC | 0 refills | Status: DC

## 2019-07-22 NOTE — Discharge Instructions (Addendum)
Use eardrops as prescribed. Return for worsening ear pain, swelling, discharge, bleeding, decreased hearing, development of jaw pain/swelling, fever.  Do NOT use Q-tips as these can cause your ear wax to get stuck, the tips may break off and become a foreign body requiring additional medical care, or puncture your eardrum.  Helpful prevention tip: Use a solution of equal parts isopropyl (rubbing) alcohol and white vinegar (acetic acid) in both ears after swimming.

## 2019-07-22 NOTE — ED Triage Notes (Signed)
Pt c/o rt ear clogged x1wk

## 2019-07-22 NOTE — ED Provider Notes (Signed)
[facility]    CSN: [id number] Arrival date & time: 07/22/19  1525      History   Chief Complaint Chief Complaint  Patient presents with  . Ear Fullness    HPI Brett Fowler is a 59 y.o. male with history of avascular necrosis of left hip, ulcerative colitis, reflux, arthritis presenting for right ear discomfort.  Endorse decreased hearing, clogged sensation.  Patient states he has had to have his ears cleaned out before.  Denies tinnitus, dizziness, foreign body exposure sensation.  No fever, nasal congestion, sore throat.  Has tried Debrox without relief.   Past Medical History:  Diagnosis Date  . Acid reflux   . Arthritis   . Avascular necrosis of bone of hip, left ([place])   . Depression   . Ulcerative colitis [facility])     Patient Active Problem List   Diagnosis Date Noted  . H/O total hip arthroplasty, left 05/03/2018  . Arthritis of left hip 04/28/2018  . Avascular necrosis of bones of both hips ([place]) 09/22/2016    Past Surgical History:  Procedure Laterality Date  . COLONOSCOPY    . HERNIA REPAIR  2004  . TOTAL HIP ARTHROPLASTY Left 04/28/2018   Procedure: LEFT TOTAL HIP ARTHROPLASTY DIRECT ANTERIOR;  Surgeon: [name], MD;  Location: [facility];  Service: Orthopedics;  Laterality: Left;  . WISDOM TOOTH EXTRACTION         Home Medications    Prior to Admission medications   Medication Sig Start Date End Date Taking? Authorizing Provider  aspirin (BAYER ASPIRIN) 325 MG tablet Take 1 tablet (325 mg total) by mouth daily. 04/30/18   [name], MD  escitalopram (LEXAPRO) 20 MG tablet Take 20 mg by mouth every evening.  08/12/16   [provider]  Garlic 5462 MG CAPS Take 1,000 mg by mouth daily.     [provider]  mercaptopurine (PURINETHOL) 50 MG tablet Take 50 mg by mouth daily.  08/12/16   [provider]  mesalamine (LIALDA) 1.2 g EC tablet Take 2.4 g by mouth daily with breakfast.  08/12/16   [provider]   methocarbamol (ROBAXIN) 500 MG tablet Take 1 tablet (500 mg total) by mouth every 6 (six) hours as needed for muscle spasms. Patient not taking: Reported on 06/16/2018 04/30/18   [name], MD  Multiple Vitamin (MULTIVITAMIN) capsule Take 1 capsule by mouth daily.    [provider]  neomycin-polymyxin-hydrocortisone (CORTISPORIN) OTIC solution Place 3 drops into the right ear 3 (three) times daily. 07/22/19   Hall-Potvin, [place], PA-C  Turmeric 500 MG TABS Take 500 mg by mouth daily.    [provider]  vitamin C (ASCORBIC ACID) 500 MG tablet Take 500 mg by mouth daily.    [provider]    Family History History reviewed. No pertinent family history.  Social History Social History   Tobacco Use  . Smoking status: Never Smoker  . Smokeless tobacco: Never Used  Substance Use Topics  . Alcohol use: Yes    Alcohol/week: 30.0 standard drinks    Types: 30 Cans of beer per week    Comment:  4-5 days a week  . Drug use: No     Allergies   Patient has no known allergies.   Review of Systems As per HPI   Physical Exam Triage Vital Signs ED Triage Vitals  Enc Vitals Group     BP      Pulse  Resp      Temp      Temp src      SpO2      Weight      Height      Head Circumference      Peak Flow      Pain Score      Pain Loc      Pain Edu?      Excl. in [place]?    No data found.  Updated Vital Signs BP (!) 160/86 (BP Location: Left Arm)   Pulse 72   Temp 97.9 F (36.6 C) (Oral)   Resp 16   SpO2 97%   Visual Acuity Right Eye Distance:   Left Eye Distance:   Bilateral Distance:    Right Eye Near:   Left Eye Near:    Bilateral Near:     Physical Exam Constitutional:      General: He is not in acute distress.    Appearance: He is not ill-appearing.  HENT:     Head: Normocephalic and atraumatic.     Right Ear: External ear normal. There is impacted cerumen.     Left Ear: Tympanic membrane, ear canal and external ear normal.      Ears:     Comments: Negative tragal tenderness bilaterally.  Impacted cerumen in right ear with mildly decreased gross hearing as compared to left. S/p irrigation: Cerumen removed, hearing improved, EAC mildly edematous with moderate erythema.  Scant discharge.    Mouth/Throat:     Mouth: Mucous membranes are moist.     Pharynx: Oropharynx is clear.  Eyes:     General: No scleral icterus.    Pupils: Pupils are equal, round, and reactive to light.  Cardiovascular:     Rate and Rhythm: Normal rate.  Pulmonary:     Effort: Pulmonary effort is normal. No respiratory distress.     Breath sounds: No wheezing.  Musculoskeletal:     Cervical back: No tenderness.  Lymphadenopathy:     Cervical: No cervical adenopathy.  Skin:    Coloration: Skin is not jaundiced or pale.  Neurological:     Mental Status: He is alert and oriented to person, place, and time.      UC Treatments / Results  Labs (all labs ordered are listed, but only abnormal results are displayed) Labs Reviewed - No data to display  EKG   Radiology No results found.  Procedures Procedures (including critical care time)  Medications Ordered in UC Medications - No data to display  Initial Impression / Assessment and Plan / UC Course  I have reviewed the triage vital signs and the nursing notes.  Pertinent labs & imaging results that were available during my care of the patient were reviewed by me and considered in my medical decision making (see chart for details).     Afebrile, nontoxic in office today.  Irrigation performed which patient tolerated well.  Reports improvement in discomfort, hearing, though moderate EAC injection with scant discharge: We will use Cortisporin to cover for possible bacterial component.  Provided contact information for ENT for further evaluation if needed.  Return precautions discussed, patient verbalized understanding and is agreeable to plan. Final Clinical Impressions(s) / UC  Diagnoses   Final diagnoses:  Hearing loss of right ear due to cerumen impaction  Acute otitis externa of right ear, unspecified type     Discharge Instructions     Use eardrops as prescribed. Return for worsening ear pain, swelling, discharge, bleeding,  decreased hearing, development of jaw pain/swelling, fever.  Do NOT use Q-tips as these can cause your ear wax to get stuck, the tips may break off and become a foreign body requiring additional medical care, or puncture your eardrum.  Helpful prevention tip: Use a solution of equal parts isopropyl (rubbing) alcohol and white vinegar (acetic acid) in both ears after swimming.    ED Prescriptions    Medication Sig Dispense Auth. Provider   neomycin-polymyxin-hydrocortisone (CORTISPORIN) OTIC solution Place 3 drops into the right ear 3 (three) times daily. 10 mL Hall-Potvin, [place], PA-C     PDMP not reviewed this encounter.   Hall-Potvin, [place], [place] 07/22/19 1545

## 2019-10-05 ENCOUNTER — Ambulatory Visit
Admission: EM | Admit: 2019-10-05 | Discharge: 2019-10-05 | Disposition: A | Discharge status: Home / Self Care | Payer: BC Managed Care – PPO | Attending: Emergency Medicine | Admitting: Emergency Medicine

## 2019-10-05 ENCOUNTER — Encounter: Payer: Self-pay | Admitting: Emergency Medicine

## 2019-10-05 ENCOUNTER — Other Ambulatory Visit: Payer: Self-pay

## 2019-10-05 DIAGNOSIS — H6123 Impacted cerumen, bilateral: Secondary | ICD-10-CM | POA: Diagnosis not present

## 2019-10-05 NOTE — Discharge Instructions (Addendum)
Return for worsening ear pain, swelling, discharge, bleeding, decreased hearing, development of jaw pain/swelling, fever.  Do NOT use Q-tips as these can cause your ear wax to get stuck, the tips may break off and become a foreign body requiring additional medical care, or puncture your eardrum.  Helpful prevention tip: Use a solution of equal parts isopropyl (rubbing) alcohol and white vinegar (acetic acid) in both ears after swimming.

## 2019-10-05 NOTE — ED Triage Notes (Signed)
Pt here for left sided ear fullness with hx of same

## 2019-10-05 NOTE — ED Provider Notes (Signed)
[facility]    CSN: [id number] Arrival date & time: 10/05/19  1558      History   Chief Complaint Chief Complaint  Patient presents with  . Ear Fullness    HPI Brett Fowler is a 59 y.o. male.  Presenting for bilateral ear fullness.  Patient does have mildly decreased hearing in left as compared to right without tinnitus, dizziness, fever, foreign body exposure sensation.  Does endorse history of cerumen impaction.   Past Medical History:  Diagnosis Date  . Acid reflux   . Arthritis   . Avascular necrosis of bone of hip, left ([place])   . Depression   . Ulcerative colitis [facility])     Patient Active Problem List   Diagnosis Date Noted  . H/O total hip arthroplasty, left 05/03/2018  . Arthritis of left hip 04/28/2018  . Avascular necrosis of bones of both hips ([place]) 09/22/2016    Past Surgical History:  Procedure Laterality Date  . COLONOSCOPY    . HERNIA REPAIR  2004  . TOTAL HIP ARTHROPLASTY Left 04/28/2018   Procedure: LEFT TOTAL HIP ARTHROPLASTY DIRECT ANTERIOR;  Surgeon: [name], MD;  Location: [facility];  Service: Orthopedics;  Laterality: Left;  . WISDOM TOOTH EXTRACTION         Home Medications    Prior to Admission medications   Medication Sig Start Date End Date Taking? Authorizing Provider  aspirin (BAYER ASPIRIN) 325 MG tablet Take 1 tablet (325 mg total) by mouth daily. 04/30/18   [name], MD  escitalopram (LEXAPRO) 20 MG tablet Take 20 mg by mouth every evening.  08/12/16   [provider]  Garlic 8756 MG CAPS Take 1,000 mg by mouth daily.     [provider]  mercaptopurine (PURINETHOL) 50 MG tablet Take 50 mg by mouth daily.  08/12/16   [provider]  mesalamine (LIALDA) 1.2 g EC tablet Take 2.4 g by mouth daily with breakfast.  08/12/16   [provider]  methocarbamol (ROBAXIN) 500 MG tablet Take 1 tablet (500 mg total) by mouth every 6 (six) hours as needed for muscle spasms. Patient not  taking: Reported on 06/16/2018 04/30/18   [name], MD  Multiple Vitamin (MULTIVITAMIN) capsule Take 1 capsule by mouth daily.    [provider]  neomycin-polymyxin-hydrocortisone (CORTISPORIN) OTIC solution Place 3 drops into the right ear 3 (three) times daily. 07/22/19   Hall-Potvin, [place], PA-C  Turmeric 500 MG TABS Take 500 mg by mouth daily.    [provider]  vitamin C (ASCORBIC ACID) 500 MG tablet Take 500 mg by mouth daily.    [provider]    Family History History reviewed. No pertinent family history.  Social History Social History   Tobacco Use  . Smoking status: Never Smoker  . Smokeless tobacco: Never Used  Vaping Use  . Vaping Use: Never used  Substance Use Topics  . Alcohol use: Yes    Alcohol/week: 30.0 standard drinks    Types: 30 Cans of beer per week    Comment:  4-5 days a week  . Drug use: No     Allergies   Patient has no known allergies.   Review of Systems As per HPI   Physical Exam Triage Vital Signs ED Triage Vitals  Enc Vitals Group     BP 10/05/19 1605 138/87     Pulse Rate 10/05/19 1605 63     Resp 10/05/19 1605 (!) 8  Temp 10/05/19 1605 98 F (36.7 C)     Temp Source 10/05/19 1605 Oral     SpO2 10/05/19 1605 97 %     Weight --      Height --      Head Circumference --      Peak Flow --      Pain Score 10/05/19 1617 0     Pain Loc --      Pain Edu? --      Excl. in [place]? --    No data found.  Updated Vital Signs BP 138/87 (BP Location: Left Arm)   Pulse 63   Temp 98 F (36.7 C) (Oral)   Resp 18   SpO2 97%   Visual Acuity Right Eye Distance:   Left Eye Distance:   Bilateral Distance:    Right Eye Near:   Left Eye Near:    Bilateral Near:     Physical Exam Constitutional:      General: He is not in acute distress. HENT:     Head: Normocephalic and atraumatic.     Right Ear: There is impacted cerumen.     Left Ear: There is impacted cerumen.     Ears:     Comments:  Negative tragal tenderness bilaterally.  S/p bilateral irrigation: Benign.  TMs intact Eyes:     General: No scleral icterus.    Pupils: Pupils are equal, round, and reactive to light.  Cardiovascular:     Rate and Rhythm: Normal rate.  Pulmonary:     Effort: Pulmonary effort is normal. No respiratory distress.     Breath sounds: No wheezing.  Skin:    Coloration: Skin is not jaundiced or pale.  Neurological:     Mental Status: He is alert and oriented to person, place, and time.      UC Treatments / Results  Labs (all labs ordered are listed, but only abnormal results are displayed) Labs Reviewed - No data to display  EKG   Radiology No results found.  Procedures Procedures (including critical care time)  Medications Ordered in UC Medications - No data to display  Initial Impression / Assessment and Plan / UC Course  I have reviewed the triage vital signs and the nursing notes.  Pertinent labs & imaging results that were available during my care of the patient were reviewed by me and considered in my medical decision making (see chart for details).     Ears irrigated in office: Tolerated well.  Repeat exam benign.  No need for further intervention at this time.  Return precautions discussed, pt verbalized understanding and is agreeable to plan. Final Clinical Impressions(s) / UC Diagnoses   Final diagnoses:  Bilateral hearing loss due to cerumen impaction     Discharge Instructions     Return for worsening ear pain, swelling, discharge, bleeding, decreased hearing, development of jaw pain/swelling, fever.  Do NOT use Q-tips as these can cause your ear wax to get stuck, the tips may break off and become a foreign body requiring additional medical care, or puncture your eardrum.  Helpful prevention tip: Use a solution of equal parts isopropyl (rubbing) alcohol and white vinegar (acetic acid) in both ears after swimming.    ED Prescriptions    None      PDMP not reviewed this encounter.   Hall-Potvin, [place], [place] 10/05/19 1629

## 2019-11-15 DIAGNOSIS — K5150 Left sided colitis without complications: Secondary | ICD-10-CM | POA: Diagnosis not present

## 2019-11-22 DIAGNOSIS — D225 Melanocytic nevi of trunk: Secondary | ICD-10-CM | POA: Diagnosis not present

## 2019-11-22 DIAGNOSIS — I788 Other diseases of capillaries: Secondary | ICD-10-CM | POA: Diagnosis not present

## 2019-11-22 DIAGNOSIS — L821 Other seborrheic keratosis: Secondary | ICD-10-CM | POA: Diagnosis not present

## 2019-12-02 DIAGNOSIS — H43393 Other vitreous opacities, bilateral: Secondary | ICD-10-CM | POA: Diagnosis not present

## 2019-12-02 DIAGNOSIS — H40033 Anatomical narrow angle, bilateral: Secondary | ICD-10-CM | POA: Diagnosis not present

## 2020-01-24 DIAGNOSIS — K5150 Left sided colitis without complications: Secondary | ICD-10-CM | POA: Diagnosis not present

## 2020-01-31 DIAGNOSIS — K5150 Left sided colitis without complications: Secondary | ICD-10-CM | POA: Diagnosis not present

## 2020-02-03 DIAGNOSIS — Z20822 Contact with and (suspected) exposure to covid-19: Secondary | ICD-10-CM | POA: Diagnosis not present

## 2020-04-20 ENCOUNTER — Ambulatory Visit: Payer: BC Managed Care – PPO | Admitting: Orthopaedic Surgery

## 2020-04-24 ENCOUNTER — Ambulatory Visit: Payer: BC Managed Care – PPO | Admitting: Orthopaedic Surgery

## 2020-04-24 ENCOUNTER — Ambulatory Visit: Payer: Self-pay

## 2020-04-24 VITALS — BP 137/85 | HR 61

## 2020-04-24 DIAGNOSIS — M87052 Idiopathic aseptic necrosis of left femur: Secondary | ICD-10-CM | POA: Diagnosis not present

## 2020-04-24 DIAGNOSIS — M87051 Idiopathic aseptic necrosis of right femur: Secondary | ICD-10-CM

## 2020-04-24 DIAGNOSIS — Z96642 Presence of left artificial hip joint: Secondary | ICD-10-CM

## 2020-04-24 NOTE — Progress Notes (Signed)
Office Visit Note   Patient: Brett Fowler           Date of Birth: 1960/12/29           MRN: [id number] Visit Date: 04/24/2020              Requested by: Aleen Campi, NP [place],  [place] [address] PCP: Aleen Campi, NP   Assessment & Plan: Visit Diagnoses:  1. History of total hip replacement, left   2. H/O total hip arthroplasty, left   3. Avascular necrosis of bones of both hips ([place])     Plan: Patient is having the results of surgery.  He could continue to do some abduction exercises on the left short-term.  If he gets progression of his right hip symptoms he can return otherwise follow-up as needed.  X-ray results reviewed in detail.  Follow-Up Instructions: Return if symptoms worsen or fail to improve.   Orders:  Orders Placed This Encounter  Procedures  . XR HIP UNILAT W OR W/O PELVIS 2-3 VIEWS LEFT   No orders of the defined types were placed in this encounter.     Procedures: No procedures performed   Clinical Data: No additional findings.   Subjective: Chief Complaint  Patient presents with  . Left Hip - Follow-up    Doing great    HPI 60 year old male previous left total of arthroplasty 04/28/2018 for left hip AVM.  He has not noticed any limp occasionally has a little bit of mild discomfort laterally on the hip.  Sometimes his right hip hurts some in the groin but not consistently.  Right hip at AVN without collapse.  He is back to full activity and states he can do whatever he wants without problems from his hip.  Review of Systems 14 point update unchanged.   Objective: Vital Signs: BP 137/85 (BP Location: Left Arm, Patient Position: Sitting)   Pulse 61   Physical Exam Constitutional:      Appearance: He is well-developed and well-nourished.  HENT:     Head: Normocephalic and atraumatic.  Eyes:     Extraocular Movements: EOM normal.     Pupils: Pupils are equal, round, and reactive to light.  Neck:     Thyroid: No  thyromegaly.     Trachea: No tracheal deviation.  Cardiovascular:     Rate and Rhythm: Normal rate.  Pulmonary:     Effort: Pulmonary effort is normal.     Breath sounds: No wheezing.  Abdominal:     General: Bowel sounds are normal.     Palpations: Abdomen is soft.  Skin:    General: Skin is warm and dry.     Capillary Refill: Capillary refill takes less than 2 seconds.  Neurological:     Mental Status: He is alert and oriented to person, place, and time.  Psychiatric:        Mood and Affect: Mood and affect normal.        Behavior: Behavior normal.        Thought Content: Thought content normal.        Judgment: Judgment normal.     Ortho Exam leg lengths equal no pain with the right hip internal/external rotation.  Lateral position abductor strength on the left is good takes good hand resistance.  With ambulation he has trace Trendelenburg gait.  Specialty Comments:  No specialty comments available.  Imaging: No results found.   PMFS History: Patient Active Problem  List   Diagnosis Date Noted  . H/O total hip arthroplasty, left 05/03/2018  . Arthritis of left hip 04/28/2018  . Avascular necrosis of bones of both hips ([place]) 09/22/2016   Past Medical History:  Diagnosis Date  . Acid reflux   . Arthritis   . Avascular necrosis of bone of hip, left ([place])   . Depression   . Ulcerative colitis ([place])     No family history on file.  Past Surgical History:  Procedure Laterality Date  . COLONOSCOPY    . HERNIA REPAIR  2004  . TOTAL HIP ARTHROPLASTY Left 04/28/2018   Procedure: LEFT TOTAL HIP ARTHROPLASTY DIRECT ANTERIOR;  Surgeon: [name], MD;  Location: [facility];  Service: Orthopedics;  Laterality: Left;  . WISDOM TOOTH EXTRACTION     Social History   Occupational History  . Occupation: Wellsite geologist  Tobacco Use  . Smoking status: Never Smoker  . Smokeless tobacco: Never Used  Vaping Use  . Vaping Use: Never used  Substance and Sexual Activity  .  Alcohol use: Yes    Alcohol/week: 30.0 standard drinks    Types: 30 Cans of beer per week    Comment:  4-5 days a week  . Drug use: No  . Sexual activity: Not on file

## 2020-05-08 DIAGNOSIS — K5150 Left sided colitis without complications: Secondary | ICD-10-CM | POA: Diagnosis not present

## 2020-07-10 DIAGNOSIS — M87052 Idiopathic aseptic necrosis of left femur: Secondary | ICD-10-CM | POA: Diagnosis not present

## 2020-07-10 DIAGNOSIS — E782 Mixed hyperlipidemia: Secondary | ICD-10-CM | POA: Diagnosis not present

## 2020-07-10 DIAGNOSIS — Z96642 Presence of left artificial hip joint: Secondary | ICD-10-CM | POA: Diagnosis not present

## 2020-07-10 DIAGNOSIS — F329 Major depressive disorder, single episode, unspecified: Secondary | ICD-10-CM | POA: Diagnosis not present

## 2020-07-10 DIAGNOSIS — Z0000 Encounter for general adult medical examination without abnormal findings: Secondary | ICD-10-CM | POA: Diagnosis not present

## 2020-07-10 DIAGNOSIS — M87051 Idiopathic aseptic necrosis of right femur: Secondary | ICD-10-CM | POA: Diagnosis not present

## 2020-07-10 DIAGNOSIS — K51919 Ulcerative colitis, unspecified with unspecified complications: Secondary | ICD-10-CM | POA: Diagnosis not present

## 2020-07-10 DIAGNOSIS — Z79899 Other long term (current) drug therapy: Secondary | ICD-10-CM | POA: Diagnosis not present

## 2020-09-04 DIAGNOSIS — K5150 Left sided colitis without complications: Secondary | ICD-10-CM | POA: Diagnosis not present

## 2020-10-15 DIAGNOSIS — J029 Acute pharyngitis, unspecified: Secondary | ICD-10-CM | POA: Diagnosis not present

## 2020-11-22 DIAGNOSIS — L570 Actinic keratosis: Secondary | ICD-10-CM | POA: Diagnosis not present

## 2020-11-22 DIAGNOSIS — L820 Inflamed seborrheic keratosis: Secondary | ICD-10-CM | POA: Diagnosis not present

## 2020-11-22 DIAGNOSIS — L821 Other seborrheic keratosis: Secondary | ICD-10-CM | POA: Diagnosis not present

## 2020-11-22 DIAGNOSIS — L649 Androgenic alopecia, unspecified: Secondary | ICD-10-CM | POA: Diagnosis not present

## 2020-11-22 DIAGNOSIS — D225 Melanocytic nevi of trunk: Secondary | ICD-10-CM | POA: Diagnosis not present

## 2020-12-20 DIAGNOSIS — S161XXA Strain of muscle, fascia and tendon at neck level, initial encounter: Secondary | ICD-10-CM | POA: Diagnosis not present

## 2021-01-02 DIAGNOSIS — K5150 Left sided colitis without complications: Secondary | ICD-10-CM | POA: Diagnosis not present

## 2021-01-30 DIAGNOSIS — H66001 Acute suppurative otitis media without spontaneous rupture of ear drum, right ear: Secondary | ICD-10-CM | POA: Diagnosis not present

## 2021-01-30 IMAGING — RF OPERATIVE LEFT HIP WITH PELVIS
1 series · 2 of 2 positions shown · non-contrast
Comparison: Left hip radiographs dated 04/23/2016

FLUOROSCOPY TIME:  21 seconds

CLINICAL DATA: Left total hip arthroplasty

EXAM:
DG C-ARM 61-120 MIN; OPERATIVE LEFT HIP WITH PELVIS

[Series 1: run · 2 of 2 slices shown]
[im 1/2]
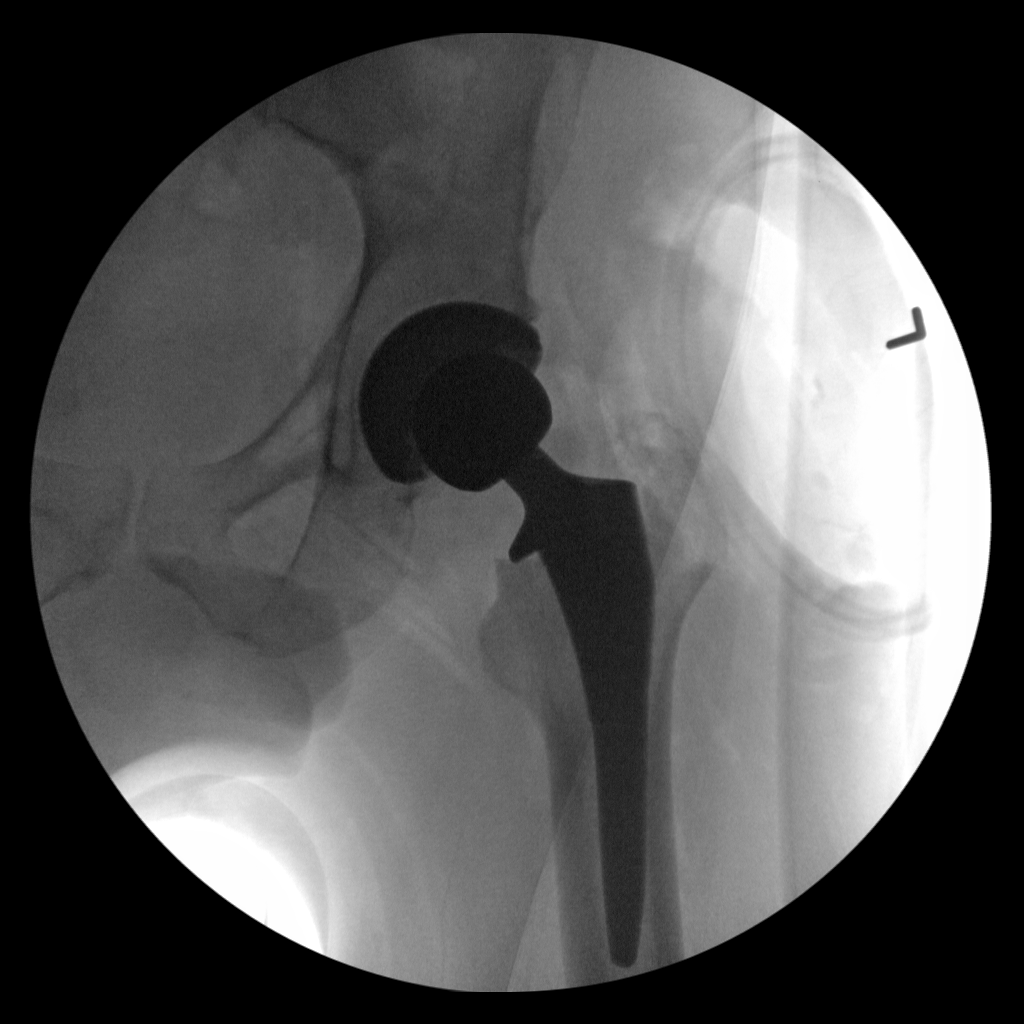
[im 2/2]
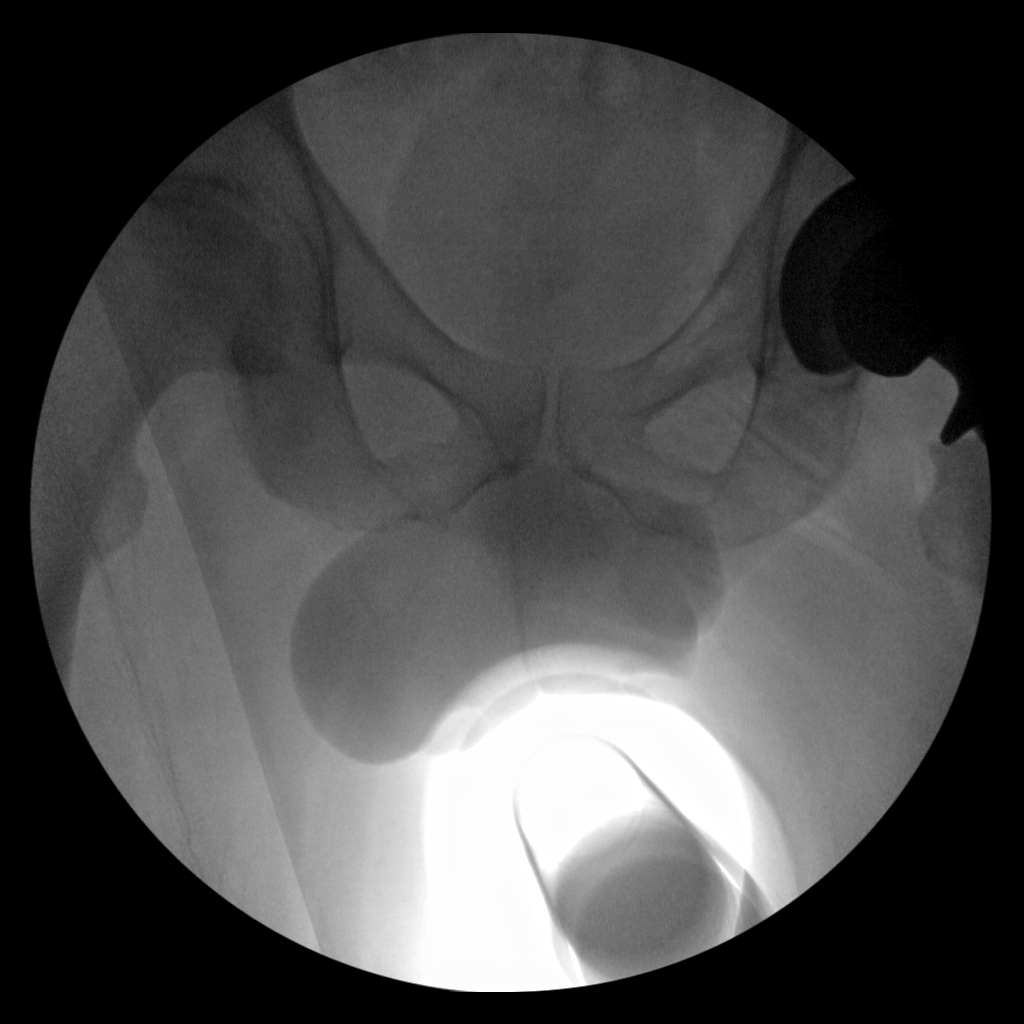

[2 of 2 positions shown; findings below may reference images not displayed]

FINDINGS: Intraoperative fluoroscopic radiographs during left total hip
arthroplasty. Satisfactory position.

No fracture or dislocation is seen.
IMPRESSION: Left total hip arthroplasty in satisfactory position.

## 2021-02-08 DIAGNOSIS — H9201 Otalgia, right ear: Secondary | ICD-10-CM | POA: Diagnosis not present

## 2021-02-12 DIAGNOSIS — H43393 Other vitreous opacities, bilateral: Secondary | ICD-10-CM | POA: Diagnosis not present

## 2021-02-12 DIAGNOSIS — H40033 Anatomical narrow angle, bilateral: Secondary | ICD-10-CM | POA: Diagnosis not present

## 2021-06-13 DIAGNOSIS — K5150 Left sided colitis without complications: Secondary | ICD-10-CM | POA: Diagnosis not present

## 2021-07-23 ENCOUNTER — Encounter: Payer: BC Managed Care – PPO | Admitting: Family Medicine

## 2021-07-23 ENCOUNTER — Encounter: Payer: Self-pay | Admitting: Family Medicine

## 2021-07-23 DIAGNOSIS — K5150 Left sided colitis without complications: Secondary | ICD-10-CM | POA: Insufficient documentation

## 2021-07-23 DIAGNOSIS — E782 Mixed hyperlipidemia: Secondary | ICD-10-CM | POA: Insufficient documentation

## 2021-07-23 DIAGNOSIS — K5190 Ulcerative colitis, unspecified, without complications: Secondary | ICD-10-CM | POA: Insufficient documentation

## 2021-08-07 ENCOUNTER — Encounter: Payer: Self-pay | Admitting: Family Medicine

## 2021-08-07 ENCOUNTER — Ambulatory Visit: Payer: BC Managed Care – PPO | Admitting: Family Medicine

## 2021-08-07 VITALS — BP 124/82 | HR 62 | Temp 97.1°F | Ht 67.0 in | Wt 191.8 lb

## 2021-08-07 DIAGNOSIS — F329 Major depressive disorder, single episode, unspecified: Secondary | ICD-10-CM | POA: Diagnosis not present

## 2021-08-07 DIAGNOSIS — Z0000 Encounter for general adult medical examination without abnormal findings: Secondary | ICD-10-CM

## 2021-08-07 DIAGNOSIS — E782 Mixed hyperlipidemia: Secondary | ICD-10-CM

## 2021-08-07 DIAGNOSIS — Z789 Other specified health status: Secondary | ICD-10-CM | POA: Diagnosis not present

## 2021-08-07 DIAGNOSIS — Z125 Encounter for screening for malignant neoplasm of prostate: Secondary | ICD-10-CM | POA: Diagnosis not present

## 2021-08-07 DIAGNOSIS — Z23 Encounter for immunization: Secondary | ICD-10-CM | POA: Diagnosis not present

## 2021-08-07 LAB — COMPREHENSIVE METABOLIC PANEL
ALT: 28 U/L (ref 0–53)
AST: 29 U/L (ref 0–37)
Albumin: 4.5 g/dL (ref 3.5–5.2)
Alkaline Phosphatase: 49 U/L (ref 39–117)
BUN: 12 mg/dL (ref 6–23)
CO2: 29 mEq/L (ref 19–32)
Calcium: 9.3 mg/dL (ref 8.4–10.5)
Chloride: 104 mEq/L (ref 96–112)
Creatinine, Ser: 0.83 mg/dL (ref 0.40–1.50)
GFR: 94.89 mL/min (ref >60.00)
Glucose, Bld: 104 mg/dL — ABNORMAL HIGH (ref 70–99)
Potassium: 4.4 mEq/L (ref 3.5–5.1)
Sodium: 139 mEq/L (ref 135–145)
Total Bilirubin: 0.5 mg/dL (ref 0.2–1.2)
Total Protein: 6.8 g/dL (ref 6.0–8.3)

## 2021-08-07 LAB — CBC WITH DIFFERENTIAL/PLATELET
Basophils Absolute: 0 10*3/uL (ref 0.0–0.1)
Basophils Relative: 1.1 % (ref 0.0–3.0)
Eosinophils Absolute: 0.1 10*3/uL (ref 0.0–0.7)
Eosinophils Relative: 2.8 % (ref 0.0–5.0)
HCT: 44.5 % (ref 39.0–52.0)
Hemoglobin: 15.3 g/dL (ref 13.0–17.0)
Lymphocytes Relative: 29.7 % (ref 12.0–46.0)
Lymphs Abs: 1 10*3/uL (ref 0.7–4.0)
MCHC: 34.3 g/dL (ref 30.0–36.0)
MCV: 106.1 fl — ABNORMAL HIGH (ref 78.0–100.0)
Monocytes Absolute: 0.3 10*3/uL (ref 0.1–1.0)
Monocytes Relative: 7.8 % (ref 3.0–12.0)
Neutro Abs: 1.9 10*3/uL (ref 1.4–7.7)
Neutrophils Relative %: 58.6 % (ref 43.0–77.0)
Platelets: 196 10*3/uL (ref 150.0–400.0)
RBC: 4.19 Mil/uL — ABNORMAL LOW (ref 4.22–5.81)
RDW: 13.8 % (ref 11.5–15.5)
WBC: 3.2 10*3/uL — ABNORMAL LOW (ref 4.0–10.5)

## 2021-08-07 LAB — PSA: PSA: 0.54 ng/mL (ref 0.10–4.00)

## 2021-08-07 LAB — URINALYSIS
Bilirubin Urine: NEGATIVE
Ketones, ur: NEGATIVE
Leukocytes,Ua: NEGATIVE
Nitrite: NEGATIVE
Specific Gravity, Urine: 1.015 (ref 1.000–1.030)
Total Protein, Urine: NEGATIVE
Urine Glucose: NEGATIVE
Urobilinogen, UA: 0.2 (ref 0.0–1.0)
pH: 6.5 (ref 5.0–8.0)

## 2021-08-07 LAB — HEMOGLOBIN A1C: Hgb A1c MFr Bld: 5.9 % (ref 4.6–6.5)

## 2021-08-07 LAB — LIPID PANEL
Cholesterol: 271 mg/dL — ABNORMAL HIGH (ref 0–200)
HDL: 53 mg/dL (ref >39.00)
NonHDL: 218.31
Total CHOL/HDL Ratio: 5
Triglycerides: 375 mg/dL — ABNORMAL HIGH (ref 0.0–149.0)
VLDL: 75 mg/dL — ABNORMAL HIGH (ref 0.0–40.0)

## 2021-08-07 LAB — LDL CHOLESTEROL, DIRECT: Direct LDL: 146 mg/dL

## 2021-08-07 LAB — TSH: TSH: 1.89 u[IU]/mL (ref 0.35–5.50)

## 2021-08-07 NOTE — Progress Notes (Signed)
New Patient Office Visit  Subjective    Patient ID: Brett Fowler, male    DOB: [birth year]  Age: 61 y.o. MRN: [id number]  CC:  Chief Complaint  Patient presents with   Establish Care    NP/establish care no concerns. Patient fasting.     HPI Brett Fowler presents to establish care Establishment of care and a physical exam.  He works full-time.  He is married and has a 61 year old special needs son with autism.  His son needs 24/7 care.  He and his wife care for him at home.  He goes to daycare for 10-4 throughout the week.Marland Kitchen  Spends the night in a group home every other Saturday.  Patient has regular dental care.  He walks a lot at work but has no regular exercise program.  Has been taking Lexapro for years for depression and it helps.  History of UC well managed by gastroenterology.  He consumes approximately 4 beers daily.  His wife presents with him.  Outpatient Encounter Medications as of 08/07/2021  Medication Sig   escitalopram (LEXAPRO) 20 MG tablet Take 20 mg by mouth every evening.    Garlic 3299 MG CAPS Take 1,000 mg by mouth daily.    glucosamine-chondroitin 500-400 MG tablet Take 1 tablet by mouth 3 (three) times daily.   mercaptopurine (PURINETHOL) 50 MG tablet Take 50 mg by mouth daily.    mesalamine (LIALDA) 1.2 g EC tablet Take 2.4 g by mouth daily with breakfast.    MILK THISTLE PO Take by mouth.   Multiple Vitamin (MULTIVITAMIN) capsule Take 1 capsule by mouth daily.   Turmeric 500 MG TABS Take 500 mg by mouth daily.   vitamin C (ASCORBIC ACID) 500 MG tablet Take 500 mg by mouth daily.   No facility-administered encounter medications on file as of 08/07/2021.    Past Medical History:  Diagnosis Date   Acid reflux    Arthritis    Avascular necrosis of bone of hip, left ([facility])    Depression    Ulcerative colitis [facility])     Past Surgical History:  Procedure Laterality Date   COLONOSCOPY     HERNIA REPAIR  2004   TOTAL HIP ARTHROPLASTY Left 04/28/2018    Procedure: LEFT TOTAL HIP ARTHROPLASTY DIRECT ANTERIOR;  Surgeon: [name], MD;  Location: [facility];  Service: Orthopedics;  Laterality: Left;   WISDOM TOOTH EXTRACTION      Family History  Problem Relation Age of Onset   Cancer Mother    Healthy Father     Social History   Socioeconomic History   Marital status: Married    Spouse name: Not on file   Number of children: Not on file   Years of education: Not on file   Highest education level: Not on file  Occupational History   Occupation: Wellsite geologist  Tobacco Use   Smoking status: Never   Smokeless tobacco: Never  Vaping Use   Vaping Use: Never used  Substance and Sexual Activity   Alcohol use: Yes    Alcohol/week: 30.0 standard drinks of alcohol    Types: 30 Cans of beer per week    Comment:  4-5 days a week   Drug use: No   Sexual activity: Yes  Other Topics Concern   Not on file  Social History Narrative   Not on file   Social Determinants of Health   Financial Resource Strain: Not on file  Food Insecurity: Not on file  Transportation Needs: Not on file  Physical Activity: Not on file  Stress: Not on file  Social Connections: Not on file  Intimate Partner Violence: Not on file    Review of Systems  Constitutional: Negative.   HENT: Negative.    Eyes:  Negative for blurred vision, discharge and redness.  Respiratory: Negative.    Cardiovascular: Negative.   Gastrointestinal:  Negative for abdominal pain, blood in stool and diarrhea.  Genitourinary: Negative.  Negative for frequency, hematuria and urgency.  Musculoskeletal: Negative.  Negative for myalgias.  Skin:  Negative for rash.  Neurological:  Negative for tingling, loss of consciousness and weakness.  Endo/Heme/Allergies:  Negative for polydipsia.         08/07/2021    8:19 AM 07/23/2021    8:29 AM  Depression screen PHQ 2/9  Decreased Interest 0 0  Down, Depressed, Hopeless 0 0  PHQ - 2 Score 0 0       Objective    BP 124/82 (BP  Location: Right Arm, Patient Position: Sitting, Cuff Size: Normal)   Pulse 62   Temp (!) 97.1 F (36.2 C) (Temporal)   Ht 5' 7"  (1.702 m)   Wt 191 lb 12.8 oz (87 kg)   SpO2 95%   BMI 30.04 kg/m   Physical Exam Constitutional:      General: He is not in acute distress.    Appearance: Normal appearance. He is not ill-appearing, toxic-appearing or diaphoretic.  HENT:     Head: Normocephalic and atraumatic.     Right Ear: External ear normal.     Left Ear: External ear normal.     Mouth/Throat:     Mouth: Mucous membranes are moist.     Pharynx: Oropharynx is clear. No oropharyngeal exudate or posterior oropharyngeal erythema.  Eyes:     General: No scleral icterus.       Right eye: No discharge.        Left eye: No discharge.     Extraocular Movements: Extraocular movements intact.     Conjunctiva/sclera: Conjunctivae normal.     Pupils: Pupils are equal, round, and reactive to light.  Cardiovascular:     Rate and Rhythm: Normal rate and regular rhythm.  Pulmonary:     Effort: Pulmonary effort is normal. No respiratory distress.     Breath sounds: Normal breath sounds.  Abdominal:     General: Bowel sounds are normal.     Tenderness: There is no abdominal tenderness. There is no guarding.     Hernia: There is no hernia in the left inguinal area or right inguinal area.  Genitourinary:    Penis: Circumcised. No hypospadias, erythema, tenderness, discharge, swelling or lesions.      Testes:        Right: Mass, tenderness or swelling not present. Right testis is descended.        Left: Mass, tenderness or swelling not present. Left testis is descended.     Epididymis:     Right: Not inflamed or enlarged.     Left: Not inflamed or enlarged.  Musculoskeletal:     Cervical back: No rigidity or tenderness.  Lymphadenopathy:     Lower Body: No right inguinal adenopathy. No left inguinal adenopathy.  Skin:    General: Skin is warm and dry.  Neurological:     Mental Status: He  is alert and oriented to person, place, and time.  Psychiatric:        Mood and Affect: Mood normal.  Behavior: Behavior normal.         Assessment & Plan:   Problem List Items Addressed This Visit       Other   Mixed hyperlipidemia   Relevant Orders   Hemoglobin A1c   Lipid panel   Comprehensive metabolic panel   Reactive depression   Relevant Orders   CBC with Differential/Platelet   TSH   Alcohol use   Relevant Orders   Comprehensive metabolic panel   Need for shingles vaccine   Relevant Orders   Varicella-zoster vaccine IM (Completed)   Need for Tdap vaccination   Relevant Orders   Tdap vaccine greater than or equal to 7yo IM (Completed)   Other Visit Diagnoses     Healthcare maintenance    -  Primary   Relevant Orders   Urinalysis   PSA       Return in about 6 months (around 02/06/2022).  Information was given about health maintenance and disease prevention.  Advised at least 30 minutes of exercise daily.  Walking is fine.  Advised regular dental care.  Discussed moderating alcohol consumption to no more than 2 servings or beers daily.  We will consider treating cholesterol pending today's results and consideration also of ASCVD risk score.  This was discussed with him.  Libby Maw, MD

## 2021-08-13 ENCOUNTER — Telehealth: Payer: Self-pay | Admitting: Family Medicine

## 2021-08-13 NOTE — Telephone Encounter (Signed)
Pt is wanting a call back concerning his Cholesterol and Triglycerides. Pt's [phone]

## 2021-08-14 NOTE — Telephone Encounter (Signed)
Returned patients call went over labs and recommendations.

## 2021-09-02 ENCOUNTER — Encounter: Payer: Self-pay | Admitting: Family Medicine

## 2021-09-30 ENCOUNTER — Other Ambulatory Visit: Payer: Self-pay | Admitting: Family Medicine

## 2021-10-03 ENCOUNTER — Telehealth: Payer: Self-pay | Admitting: Family Medicine

## 2021-10-03 DIAGNOSIS — F329 Major depressive disorder, single episode, unspecified: Secondary | ICD-10-CM

## 2021-10-03 MED ORDER — ESCITALOPRAM OXALATE 20 MG PO TABS: 20.0000 mg | ORAL_TABLET | Freq: Every evening | ORAL | 1 refills | Status: DC

## 2021-10-03 NOTE — Telephone Encounter (Signed)
Caller Name: Pt Call back phone #: [phone]  Reason for Call: Pt has 2 pills left of his Lexapro he would like a refill sent.

## 2021-11-05 DIAGNOSIS — K5190 Ulcerative colitis, unspecified, without complications: Secondary | ICD-10-CM | POA: Diagnosis not present

## 2021-11-29 ENCOUNTER — Ambulatory Visit: Payer: BC Managed Care – PPO | Admitting: Internal Medicine

## 2021-11-29 ENCOUNTER — Encounter: Payer: Self-pay | Admitting: Internal Medicine

## 2021-11-29 VITALS — BP 126/68 | HR 66 | Temp 98.2°F | Resp 16 | Ht 67.0 in | Wt 187.1 lb

## 2021-11-29 DIAGNOSIS — E782 Mixed hyperlipidemia: Secondary | ICD-10-CM | POA: Diagnosis not present

## 2021-11-29 DIAGNOSIS — Z23 Encounter for immunization: Secondary | ICD-10-CM | POA: Diagnosis not present

## 2021-11-29 DIAGNOSIS — F329 Major depressive disorder, single episode, unspecified: Secondary | ICD-10-CM

## 2021-11-29 NOTE — Progress Notes (Unsigned)
Subjective:    Patient ID: Brett Fowler, male    DOB: Sep 22, 1960, 61 y.o.   MRN: [id number]  DOS:  11/29/2021 Type of visit - description: Transferring to this office  Transferring to this office In general feels well. Chronic medical problems seem well controlled. I reviewed the chart, recent labs, immunizations.   Review of Systems See above   Past Medical History:  Diagnosis Date   Acid reflux    Alcohol abuse    Arthritis    Avascular necrosis of bone of hip, left (H[facility])    Depression    Hyperlipidemia    Ulcerative colitis ([place])    in remission    Past Surgical History:  Procedure Laterality Date   HERNIA REPAIR  2004   TOTAL HIP ARTHROPLASTY Left 04/28/2018   Procedure: LEFT TOTAL HIP ARTHROPLASTY DIRECT ANTERIOR;  Surgeon: [name], MD;  Location: [facility];  Service: Orthopedics;  Laterality: Left;   WISDOM TOOTH EXTRACTION     Social History   Socioeconomic History   Marital status: Married    Spouse name: Not on file   Number of children: 1   Years of education: Not on file   Highest education level: Not on file  Occupational History   Occupation: Wellsite geologist  Tobacco Use   Smoking status: Never   Smokeless tobacco: Never  Vaping Use   Vaping Use: Never used  Substance and Sexual Activity   Alcohol use: Yes    Alcohol/week: 30.0 standard drinks of alcohol    Types: 30 Cans of beer per week    Comment: 4-5 days a week, heavy at times   Drug use: No   Sexual activity: Yes  Other Topics Concern   Not on file  Social History Narrative   Household:   Pt, wife and son w/ severe autism   Social Determinants of Health   Financial Resource Strain: Not on file  Food Insecurity: Not on file  Transportation Needs: Not on file  Physical Activity: Not on file  Stress: Not on file  Social Connections: Not on file  Intimate Partner Violence: Not on file   Family History  Problem Relation Age of Onset   Breast cancer Mother        breast    Healthy Father    Colon cancer Neg Hx    Prostate cancer Neg Hx    CAD Neg Hx    Diabetes Neg Hx     Current Outpatient Medications  Medication Instructions   ascorbic acid (VITAMIN C) 500 mg, Oral, Daily   escitalopram (LEXAPRO) 20 mg, Oral, Every evening   Garlic 6,834 mg, Oral, Daily   glucosamine-chondroitin 500-400 MG tablet 1 tablet, Oral, 3 times daily   mercaptopurine (PURINETHOL) 50 mg, Oral, Daily   mesalamine (LIALDA) 2.4 g, Oral, Daily with breakfast   MILK THISTLE PO Oral   Multiple Vitamin (MULTIVITAMIN) capsule 1 capsule, Oral, Daily   Red Yeast Rice 600 mg, Oral, 2 times daily   Turmeric 500 mg, Oral, Daily       Objective:   Physical Exam BP 126/68   Pulse 66   Temp 98.2 F (36.8 C) (Oral)   Resp 16   Ht 5' 7"  (1.702 m)   Wt 187 lb 2 oz (84.9 kg)   SpO2 96%   BMI 29.31 kg/m  General:   Well developed, NAD, BMI noted.  HEENT:  Normocephalic . Face symmetric, atraumatic Lungs:  CTA B Normal respiratory effort,  no intercostal retractions, no accessory muscle use. Heart: RRR,  no murmur.  Abdomen:  Not distended, soft, non-tender. No rebound or rigidity.   Skin: Not pale. Not jaundice Lower extremities: no pretibial edema bilaterally  Neurologic:  alert & oriented X3.  Speech normal, gait appropriate for age and unassisted Psych--  Cognition and judgment appear intact.  Cooperative with normal attention span and concentration.  Behavior appropriate. No anxious or depressed appearing.     Assessment    Assessment (new patient 11-2021, transferring to this office). Hyperglycemia Ulcerative colitis- [facility], Dr Paulita Fujita Hyperlipidemia Anxiety depression B hip Avascular Necrosis  L total hip replacement 2020 d/t hip collapse ETOH  PLAN New patient, transferring to this office Recent labs reviewed: Ulcerative colitis: Getting records from GI Hyperlipidemia: Diet controlled, last cholesterol panel from 07/2021:  Total cholesterol: 271 LDL  146, triglycerides 375. Advised about healthy diet, avoid excessive EtOH, he just started red yeast rice and request labs on follow-up. Abnormal CBC: CBC with increased MCV.  Further eval on RTC. Mild hyperglycemia: A1c was 5.9 07/2021, dietary advice provided Vaccine advice: Tdap 2023 PNM 23: 2006 Shingrix #1 07-2021, #2 today Covid vax: recomended  Flu shot today  RTC fasting 3 months

## 2021-11-29 NOTE — Patient Instructions (Addendum)
You got your second and last Shingrix vaccine today  You got your flu shot today  Proceed with the COVID-vaccine in 2 to 3 weeks     [place], [place] back for a follow up in 3 months, fasting

## 2021-12-01 DIAGNOSIS — Z09 Encounter for follow-up examination after completed treatment for conditions other than malignant neoplasm: Secondary | ICD-10-CM | POA: Insufficient documentation

## 2021-12-01 NOTE — Assessment & Plan Note (Signed)
New patient, transferring to this office Recent labs reviewed: Ulcerative colitis: Getting records from GI Hyperlipidemia: Diet controlled, last cholesterol panel from 07/2021:  Total cholesterol: 271 LDL 146, triglycerides 375. Advised about healthy diet, avoid excessive EtOH, he just started red yeast rice and request labs on follow-up. Abnormal CBC: CBC with increased MCV.  Further eval on RTC. Mild hyperglycemia: A1c was 5.9 07/2021, dietary advice provided Vaccine advice: Tdap 2023 PNM 23: 2006 Shingrix #1 07-2021, #2 today Covid vax: recomended  Flu shot today  RTC fasting 3 months

## 2021-12-10 ENCOUNTER — Encounter: Payer: Self-pay | Admitting: Internal Medicine

## 2021-12-30 ENCOUNTER — Telehealth: Payer: Self-pay | Admitting: Internal Medicine

## 2021-12-30 DIAGNOSIS — F329 Major depressive disorder, single episode, unspecified: Secondary | ICD-10-CM

## 2021-12-30 MED ORDER — ESCITALOPRAM OXALATE 20 MG PO TABS: 20.0000 mg | ORAL_TABLET | Freq: Every evening | ORAL | 1 refills | Status: DC

## 2021-12-30 NOTE — Telephone Encounter (Signed)
I reviewed the chart, as far as I can tell he only takes escitalopram 20 mg 1 tablet daily.  Okay to send a prescription.

## 2021-12-30 NOTE — Telephone Encounter (Signed)
Patient is a new patient of Dr. Larose Kells and this is the first time he will be prescribing the medication for him so he could not call it in.    Medication: escitalopram (LEXAPRO) 10 and 20 MG tablet [[id number]] Patient said he takes both dosages but didn't see in the chart    Preferred Pharmacy (with phone number or street name): [facility], [facility] - [address] [place], [place] [address] Phone: [phone]  Fax: [phone]   Agent: Please be advised that RX refills may take up to 3 business days. We ask that you follow-up with your pharmacy.

## 2021-12-30 NOTE — Telephone Encounter (Signed)
Please advise? Pt states he takes escitalopram 59m and 234m

## 2021-12-30 NOTE — Telephone Encounter (Signed)
Rx sent for 49m. I spoke w/ Pt- he informed that he has been taking 361min total, this was given to him by previous PCP prior to Dr. KrEthelene Halhe wonders if it was documented incorrectly originally when he started seeing a [facility] provider.

## 2021-12-31 NOTE — Telephone Encounter (Signed)
Mychart message sent.

## 2021-12-31 NOTE — Telephone Encounter (Signed)
Recommend to take Lexapro 20 mg daily.  Reassess the need to adjust medicines at the next visit

## 2022-01-14 ENCOUNTER — Other Ambulatory Visit (HOSPITAL_BASED_OUTPATIENT_CLINIC_OR_DEPARTMENT_OTHER): Payer: Self-pay

## 2022-01-14 MED ORDER — COMIRNATY 30 MCG/0.3ML IM SUSY
PREFILLED_SYRINGE | INTRAMUSCULAR | 0 refills | Status: DC
  Filled 2022-01-14: qty 0.3, 1d supply, fill #0

## 2022-02-06 ENCOUNTER — Ambulatory Visit: Payer: BC Managed Care – PPO | Admitting: Family Medicine

## 2022-03-04 ENCOUNTER — Ambulatory Visit: Payer: BC Managed Care – PPO | Admitting: Internal Medicine

## 2022-03-04 ENCOUNTER — Encounter: Payer: Self-pay | Admitting: Internal Medicine

## 2022-03-04 ENCOUNTER — Telehealth: Payer: Self-pay

## 2022-03-04 VITALS — BP 132/82 | HR 60 | Temp 97.9°F | Resp 16 | Ht 67.0 in | Wt 187.5 lb

## 2022-03-04 DIAGNOSIS — F32A Depression, unspecified: Secondary | ICD-10-CM

## 2022-03-04 DIAGNOSIS — R7989 Other specified abnormal findings of blood chemistry: Secondary | ICD-10-CM | POA: Diagnosis not present

## 2022-03-04 DIAGNOSIS — E782 Mixed hyperlipidemia: Secondary | ICD-10-CM

## 2022-03-04 DIAGNOSIS — F419 Anxiety disorder, unspecified: Secondary | ICD-10-CM

## 2022-03-04 DIAGNOSIS — Z114 Encounter for screening for human immunodeficiency virus [HIV]: Secondary | ICD-10-CM

## 2022-03-04 DIAGNOSIS — Z1159 Encounter for screening for other viral diseases: Secondary | ICD-10-CM

## 2022-03-04 DIAGNOSIS — F329 Major depressive disorder, single episode, unspecified: Secondary | ICD-10-CM | POA: Diagnosis not present

## 2022-03-04 DIAGNOSIS — Z0000 Encounter for general adult medical examination without abnormal findings: Secondary | ICD-10-CM | POA: Insufficient documentation

## 2022-03-04 DIAGNOSIS — K51919 Ulcerative colitis, unspecified with unspecified complications: Secondary | ICD-10-CM | POA: Diagnosis not present

## 2022-03-04 DIAGNOSIS — R739 Hyperglycemia, unspecified: Secondary | ICD-10-CM

## 2022-03-04 LAB — CBC WITH DIFFERENTIAL/PLATELET
Basophils Absolute: 0 10*3/uL (ref 0.0–0.1)
Basophils Relative: 1.3 % (ref 0.0–3.0)
Eosinophils Absolute: 0.1 10*3/uL (ref 0.0–0.7)
Eosinophils Relative: 3.4 % (ref 0.0–5.0)
HCT: 44.5 % (ref 39.0–52.0)
Hemoglobin: 15.6 g/dL (ref 13.0–17.0)
Lymphocytes Relative: 29.5 % (ref 12.0–46.0)
Lymphs Abs: 0.9 10*3/uL (ref 0.7–4.0)
MCHC: 35.1 g/dL (ref 30.0–36.0)
MCV: 105.5 fl — ABNORMAL HIGH (ref 78.0–100.0)
Monocytes Absolute: 0.3 10*3/uL (ref 0.1–1.0)
Monocytes Relative: 10 % (ref 3.0–12.0)
Neutro Abs: 1.7 10*3/uL (ref 1.4–7.7)
Neutrophils Relative %: 55.8 % (ref 43.0–77.0)
Platelets: 207 10*3/uL (ref 150.0–400.0)
RBC: 4.22 Mil/uL (ref 4.22–5.81)
RDW: 13.2 % (ref 11.5–15.5)
WBC: 3.1 10*3/uL — ABNORMAL LOW (ref 4.0–10.5)

## 2022-03-04 LAB — COMPREHENSIVE METABOLIC PANEL
ALT: 26 U/L (ref 0–53)
AST: 25 U/L (ref 0–37)
Albumin: 4.5 g/dL (ref 3.5–5.2)
Alkaline Phosphatase: 43 U/L (ref 39–117)
BUN: 13 mg/dL (ref 6–23)
CO2: 31 mEq/L (ref 19–32)
Calcium: 9.2 mg/dL (ref 8.4–10.5)
Chloride: 104 mEq/L (ref 96–112)
Creatinine, Ser: 0.86 mg/dL (ref 0.40–1.50)
GFR: 93.51 mL/min (ref >60.00)
Glucose, Bld: 114 mg/dL — ABNORMAL HIGH (ref 70–99)
Potassium: 4.8 mEq/L (ref 3.5–5.1)
Sodium: 141 mEq/L (ref 135–145)
Total Bilirubin: 1 mg/dL (ref 0.2–1.2)
Total Protein: 6.8 g/dL (ref 6.0–8.3)

## 2022-03-04 LAB — HEMOGLOBIN A1C: Hgb A1c MFr Bld: 5.9 % (ref 4.6–6.5)

## 2022-03-04 LAB — B12 AND FOLATE PANEL
Folate: >23.8 ng/mL (ref >5.9)
Vitamin B-12: 746 pg/mL (ref 211–911)

## 2022-03-04 LAB — LIPID PANEL
Cholesterol: 251 mg/dL — ABNORMAL HIGH (ref 0–200)
HDL: 60.5 mg/dL (ref >39.00)
LDL Cholesterol: 158 mg/dL — ABNORMAL HIGH (ref 0–99)
NonHDL: 190.91
Total CHOL/HDL Ratio: 4
Triglycerides: 163 mg/dL — ABNORMAL HIGH (ref 0.0–149.0)
VLDL: 32.6 mg/dL (ref 0.0–40.0)

## 2022-03-04 MED ORDER — ESCITALOPRAM OXALATE 20 MG PO TABS: 30.0000 mg | ORAL_TABLET | Freq: Every evening | ORAL | 1 refills | Status: DC

## 2022-03-04 NOTE — Telephone Encounter (Signed)
PA initiated via Covermymeds; KEY: [id number]. Awaiting determination.

## 2022-03-04 NOTE — Progress Notes (Signed)
Subjective:    Patient ID: Brett Fowler, male    DOB: 26-Dec-1960, 62 y.o.   MRN: [id number]  DOS:  03/04/2022 Type of visit - description: f/u  Since the last office visit is doing well, has no major concerns. We talk about his chronic medical problems. Labs reviewed. History of ulcerative colitis, currently asymptomatic.   Review of Systems See above   Past Medical History:  Diagnosis Date   Acid reflux    Alcohol abuse    Arthritis    Avascular necrosis of bone of hip, left (HCC)    Depression    Hyperlipidemia    Ulcerative colitis ([place])    in remission    Past Surgical History:  Procedure Laterality Date   HERNIA REPAIR  2004   TOTAL HIP ARTHROPLASTY Left 04/28/2018   Procedure: LEFT TOTAL HIP ARTHROPLASTY DIRECT ANTERIOR;  Surgeon: [name], MD;  Location: [facility];  Service: Orthopedics;  Laterality: Left;   WISDOM TOOTH EXTRACTION      Current Outpatient Medications  Medication Instructions   ascorbic acid (VITAMIN C) 500 mg, Oral, Daily   escitalopram (LEXAPRO) 30 mg, Oral, Every evening   Garlic 6,789 mg, Oral, Daily   glucosamine-chondroitin 500-400 MG tablet 1 tablet, Oral, 3 times daily   mercaptopurine (PURINETHOL) 50 mg, Oral, Daily   mesalamine (LIALDA) 2.4 g, Oral, Daily with breakfast   MILK THISTLE PO Oral   Multiple Vitamin (MULTIVITAMIN) capsule 1 capsule, Oral, Daily   Red Yeast Rice 600 mg, Oral, 2 times daily   Turmeric 500 mg, Oral, Daily       Objective:   Physical Exam BP 132/82   Pulse 60   Temp 97.9 F (36.6 C) (Oral)   Resp 16   Ht 5\' 7"  (1.702 m)   Wt 187 lb 8 oz (85 kg)   SpO2 97%   BMI 29.37 kg/m  General:   Well developed, NAD, BMI noted. HEENT:  Normocephalic . Face symmetric, atraumatic Lungs:  CTA B Normal respiratory effort, no intercostal retractions, no accessory muscle use. Heart: RRR,  no murmur.  Lower extremities: no pretibial edema bilaterally  Skin: Not pale. Not jaundice Neurologic:  alert &  oriented X3.  Speech normal, gait appropriate for age and unassisted Psych--  Cognition and judgment appear intact.  Cooperative with normal attention span and concentration.  Behavior appropriate. No anxious or depressed appearing.      Assessment     Assessment (new patient 11-2021, transferring to this office). Hyperglycemia Ulcerative colitis- [facility], Dr Paulita Fujita Hyperlipidemia Anxiety depression B hip Avascular Necrosis  L total hip replacement 2020 d/t hip collapse ETOH  PLAN Hyperglycemia: Check A1c Ulcerative colitis: Sees GI regularly, request a CMP.  Will do.  Had a colonoscopy 2019 and reports he is due for a colonoscopy this year, plans to pursue.  Currently seems well-controlled Hyperlipidemia:On no medications however since the last visit has tried to improve his diet.  Also started red yeast rice.  Current CV RF at 10 years is 12.3%.  This was discussed with the patient.  Check labs. Abnormal CBC: Increased MCV, recheck CBC, folic acid and F81 noting that he is a heavy drinker. Anxiety depression: He clarify the dose of Lexapro he takes, has taken 30 mg for a while with good control.  New prescription sent for Lexapro 20 mg  --1.5 tablets daily. EtOH: See below Social: He has a 28 year old son severely autistic, that increases his stress, he said is the  reason why he drinks alcohol sometimes very heavy.  Counseled the best I can. Preventive care: HIV hep C. RTC 6 months CPX

## 2022-03-04 NOTE — Assessment & Plan Note (Signed)
Hyperglycemia: Check A1c Ulcerative colitis: Sees GI regularly, request a CMP.  Will do.  Had a colonoscopy 2019 and reports he is due for a colonoscopy this year, plans to pursue.  Currently seems well-controlled Hyperlipidemia:On no medications however since the last visit has tried to improve his diet.  Also started red yeast rice.  Current CV RF at 10 years is 12.3%.  This was discussed with the patient.  Check labs. Abnormal CBC: Increased MCV, recheck CBC, folic acid and R74 noting that he is a heavy drinker. Anxiety depression: He clarify the dose of Lexapro he takes, has taken 30 mg for a while with good control.  New prescription sent for Lexapro 20 mg  --1.5 tablets daily. EtOH: See below Social: He has a 62 year old son severely autistic, that increases his stress, he said is the reason why he drinks alcohol sometimes very heavy.  Counseled the best I can. Preventive care: HIV hep C. RTC 6 months CPX

## 2022-03-04 NOTE — Patient Instructions (Signed)
    GO TO THE LAB : Get the blood work     GO TO THE FRONT DESK, Brett Fowler Come back for   a physical exam in 6 months.  Come back sooner if needed.

## 2022-03-05 LAB — HEPATITIS C ANTIBODY: Hepatitis C Ab: NONREACTIVE

## 2022-03-05 LAB — HIV ANTIBODY (ROUTINE TESTING W REFLEX): HIV 1&2 Ab, 4th Generation: NONREACTIVE

## 2022-03-05 NOTE — Telephone Encounter (Signed)
PA approved.   Effective from 03/04/2022 through 03/03/2023.

## 2022-04-02 NOTE — Progress Notes (Signed)
This encounter was created in error - please disregard.

## 2022-06-20 DIAGNOSIS — K5150 Left sided colitis without complications: Secondary | ICD-10-CM | POA: Diagnosis not present

## 2022-07-31 DIAGNOSIS — H43811 Vitreous degeneration, right eye: Secondary | ICD-10-CM | POA: Diagnosis not present

## 2022-07-31 DIAGNOSIS — H3561 Retinal hemorrhage, right eye: Secondary | ICD-10-CM | POA: Diagnosis not present

## 2022-09-02 DIAGNOSIS — H3561 Retinal hemorrhage, right eye: Secondary | ICD-10-CM | POA: Diagnosis not present

## 2022-09-02 DIAGNOSIS — H43811 Vitreous degeneration, right eye: Secondary | ICD-10-CM | POA: Diagnosis not present

## 2022-09-09 ENCOUNTER — Encounter: Payer: BC Managed Care – PPO | Admitting: Internal Medicine

## 2022-09-23 ENCOUNTER — Other Ambulatory Visit: Payer: Self-pay | Admitting: Internal Medicine

## 2022-09-23 DIAGNOSIS — F329 Major depressive disorder, single episode, unspecified: Secondary | ICD-10-CM

## 2022-11-03 ENCOUNTER — Encounter: Payer: Self-pay | Admitting: Internal Medicine

## 2022-11-04 ENCOUNTER — Ambulatory Visit (INDEPENDENT_AMBULATORY_CARE_PROVIDER_SITE_OTHER): Payer: BC Managed Care – PPO | Admitting: Internal Medicine

## 2022-11-04 ENCOUNTER — Encounter: Payer: Self-pay | Admitting: Internal Medicine

## 2022-11-04 VITALS — BP 132/72 | HR 55 | Temp 98.2°F | Resp 16 | Ht 67.0 in | Wt 187.1 lb

## 2022-11-04 DIAGNOSIS — K51919 Ulcerative colitis, unspecified with unspecified complications: Secondary | ICD-10-CM

## 2022-11-04 DIAGNOSIS — Z23 Encounter for immunization: Secondary | ICD-10-CM | POA: Diagnosis not present

## 2022-11-04 DIAGNOSIS — H6120 Impacted cerumen, unspecified ear: Secondary | ICD-10-CM | POA: Diagnosis not present

## 2022-11-04 DIAGNOSIS — Z0000 Encounter for general adult medical examination without abnormal findings: Secondary | ICD-10-CM | POA: Diagnosis not present

## 2022-11-04 DIAGNOSIS — E782 Mixed hyperlipidemia: Secondary | ICD-10-CM | POA: Diagnosis not present

## 2022-11-04 DIAGNOSIS — Z0001 Encounter for general adult medical examination with abnormal findings: Secondary | ICD-10-CM

## 2022-11-04 DIAGNOSIS — Z125 Encounter for screening for malignant neoplasm of prostate: Secondary | ICD-10-CM

## 2022-11-04 DIAGNOSIS — R739 Hyperglycemia, unspecified: Secondary | ICD-10-CM

## 2022-11-04 DIAGNOSIS — F32A Depression, unspecified: Secondary | ICD-10-CM

## 2022-11-04 DIAGNOSIS — F419 Anxiety disorder, unspecified: Secondary | ICD-10-CM

## 2022-11-04 LAB — CBC WITH DIFFERENTIAL/PLATELET
Basophils Absolute: 0 10*3/uL (ref 0.0–0.1)
Basophils Relative: 1.1 % (ref 0.0–3.0)
Eosinophils Absolute: 0.1 10*3/uL (ref 0.0–0.7)
Eosinophils Relative: 2.5 % (ref 0.0–5.0)
HCT: 45.7 % (ref 39.0–52.0)
Hemoglobin: 15.4 g/dL (ref 13.0–17.0)
Lymphocytes Relative: 27.4 % (ref 12.0–46.0)
Lymphs Abs: 1 10*3/uL (ref 0.7–4.0)
MCHC: 33.8 g/dL (ref 30.0–36.0)
MCV: 104.3 fl — ABNORMAL HIGH (ref 78.0–100.0)
Monocytes Absolute: 0.4 10*3/uL (ref 0.1–1.0)
Monocytes Relative: 9.6 % (ref 3.0–12.0)
Neutro Abs: 2.2 10*3/uL (ref 1.4–7.7)
Neutrophils Relative %: 59.4 % (ref 43.0–77.0)
Platelets: 200 10*3/uL (ref 150.0–400.0)
RBC: 4.38 Mil/uL (ref 4.22–5.81)
RDW: 13 % (ref 11.5–15.5)
WBC: 3.8 10*3/uL — ABNORMAL LOW (ref 4.0–10.5)

## 2022-11-04 LAB — HEMOGLOBIN A1C: Hgb A1c MFr Bld: 5.9 % (ref 4.6–6.5)

## 2022-11-04 LAB — PSA: PSA: 0.52 ng/mL (ref 0.10–4.00)

## 2022-11-04 LAB — LIPID PANEL
Cholesterol: 211 mg/dL — ABNORMAL HIGH (ref 0–200)
HDL: 62.4 mg/dL (ref >39.00)
LDL Cholesterol: 123 mg/dL — ABNORMAL HIGH (ref 0–99)
NonHDL: 148.34
Total CHOL/HDL Ratio: 3
Triglycerides: 129 mg/dL (ref 0.0–149.0)
VLDL: 25.8 mg/dL (ref 0.0–40.0)

## 2022-11-04 LAB — COMPREHENSIVE METABOLIC PANEL
ALT: 22 U/L (ref 0–53)
AST: 24 U/L (ref 0–37)
Albumin: 4.3 g/dL (ref 3.5–5.2)
Alkaline Phosphatase: 54 U/L (ref 39–117)
BUN: 14 mg/dL (ref 6–23)
CO2: 28 meq/L (ref 19–32)
Calcium: 9.1 mg/dL (ref 8.4–10.5)
Chloride: 104 meq/L (ref 96–112)
Creatinine, Ser: 0.82 mg/dL (ref 0.40–1.50)
GFR: 94.42 mL/min (ref >60.00)
Glucose, Bld: 110 mg/dL — ABNORMAL HIGH (ref 70–99)
Potassium: 4.7 meq/L (ref 3.5–5.1)
Sodium: 139 meq/L (ref 135–145)
Total Bilirubin: 0.6 mg/dL (ref 0.2–1.2)
Total Protein: 6.8 g/dL (ref 6.0–8.3)

## 2022-11-04 NOTE — Progress Notes (Signed)
Subjective:    Patient ID: Brett Fowler, male    DOB: Oct 20, 1960, 62 y.o.   MRN: [id number]  DOS:  11/04/2022 Type of visit - description: Here for CPX  Here for CPX. In general feeling well. Right ear felt clogged for the last few days.  No pain. Denies GI symptoms at present. Stress is the same, still drinking EtOH.  Review of Systems  Other than above, a 14 point review of systems is negative    Past Medical History:  Diagnosis Date   Acid reflux    Alcohol abuse    Arthritis    Avascular necrosis of bone of hip, left (HCC)    Depression    Hyperlipidemia    Ulcerative colitis (HCC)    in remission    Past Surgical History:  Procedure Laterality Date   HERNIA REPAIR  2004   TOTAL HIP ARTHROPLASTY Left 04/28/2018   Procedure: LEFT TOTAL HIP ARTHROPLASTY DIRECT ANTERIOR;  Surgeon: Eldred Manges, MD;  Location: [facility] OR;  Service: Orthopedics;  Laterality: Left;   WISDOM TOOTH EXTRACTION     Social History   Socioeconomic History   Marital status: Married    Spouse name: Not on file   Number of children: 1   Years of education: Not on file   Highest education level: Not on file  Occupational History   Occupation: Teaching laboratory technician  Tobacco Use   Smoking status: Never   Smokeless tobacco: Never  Vaping Use   Vaping status: Never Used  Substance and Sexual Activity   Alcohol use: Yes    Alcohol/week: 30.0 standard drinks of alcohol    Types: 30 Cans of beer per week    Comment: 4-5 days a week, heavy at times   Drug use: No   Sexual activity: Yes  Other Topics Concern   Not on file  Social History Narrative   Household:   Pt, wife and son w/ severe autism   Social Determinants of Health   Financial Resource Strain: Not on file  Food Insecurity: Not on file  Transportation Needs: Not on file  Physical Activity: Not on file  Stress: Not on file  Social Connections: Not on file  Intimate Partner Violence: Not on file     Current Outpatient  Medications  Medication Instructions   ascorbic acid (VITAMIN C) 500 mg, Oral, Daily   escitalopram (LEXAPRO) 20 MG tablet TAKE 1 & 1/2 TABLETS (30 MG DOSE) BY MOUTH EVERY EVENING   Garlic 1,000 mg, Oral, Daily   glucosamine-chondroitin 500-400 MG tablet 1 tablet, Oral, 3 times daily   mercaptopurine (PURINETHOL) 50 mg, Oral, Daily   mesalamine (LIALDA) 2.4 g, Oral, Daily with breakfast   MILK THISTLE PO Oral   Multiple Vitamin (MULTIVITAMIN) capsule 1 capsule, Oral, Daily   Red Yeast Rice 600 mg, Oral, 2 times daily   Turmeric 500 mg, Oral, Daily       Objective:   Physical Exam BP 132/72   Pulse (!) 55   Temp 98.2 F (36.8 C) (Oral)   Resp 16   Ht 5\' 7"  (1.702 m)   Wt 187 lb 2 oz (84.9 kg)   SpO2 98%   BMI 29.31 kg/m  General: Well developed, NAD, BMI noted Neck: No  thyromegaly  HEENT:  Normocephalic . Face symmetric, atraumatic. Right ear: + Cerumen impaction. Left ear: Mild amount of cerumen. Lungs:  CTA B Normal respiratory effort, no intercostal retractions, no accessory muscle use. Heart: RRR,  no murmur.  Abdomen:  Not distended, soft, non-tender. No rebound or rigidity.   Lower extremities: no pretibial edema bilaterally  Skin: Exposed areas without rash. Not pale. Not jaundice Neurologic:  alert & oriented X3.  Speech normal, gait appropriate for age and unassisted Strength symmetric and appropriate for age.  Psych: Cognition and judgment appear intact.  Cooperative with normal attention span and concentration.  Behavior appropriate. No anxious or depressed appearing.     Assessment    Assessment (new patient 11-2021, transferring to this office). Hyperglycemia Ulcerative colitis- [facility], Dr Dulce Sellar Hyperlipidemia Anxiety depression B hip Avascular Necrosis L total hip replacement 2020 d/t hip collapse ETOH  PLAN Here for CPX Tdap 2023. S/p Shingrix Vaccines today: PNM 20 (history of ulcerative colitis) and a flu shot. Vaccines are  recommended: RSV, COVID booster. CCS: Per GI, overdue for cscope , plans to reach out to GI Prostate cancer screening: no FH, PSA 07/2021 normal.  Recheck. Labs:  CMP FLP CBC PSA A1c Diet and exercise discussed Hyperglycemia: Checking A1c U.C.: Due for a colonoscopy, strongly encouraged to call GI as he is planning Hyperlipidemia: Diet controlled, current CV risk 11.3%.  Patient aware he qualifies for statins. Anxiety, depression: Stress at baseline, managing okay.  See next EtOH: Binge drinking, his brother had pancreatitis from EtOH, patient is worried about EtOH, advised there are a number of programs that he could tap when she is ready.  Cerumen impaction: With the patient permission I removed a significant amount of wax from the right ear with a spoon.  Unable to completely clear it.  Recommend peroxide, if not better will make a nurse visit for a lavage RTC 6 months

## 2022-11-04 NOTE — Patient Instructions (Addendum)
You are getting your flu shot and a pneumonia shot today. All other vaccines I recommend: Covid booster- new this fall RSV vaccine     GO TO THE LAB : Get the blood work     Next visit with me in 6 months for a routine checkup    Please schedule it at the front desk     "Health Care Power of attorney" ,  "Living will" (Advance care planning documents)  If you already have a living will or healthcare power of attorney, is recommended you bring the copy to be scanned in your chart.   The document will be available to all the doctors you see in the system.  Advance care planning is a process that supports adults in  understanding and sharing their preferences regarding future medical care.  The patient's preferences are recorded in documents called Advance Directives and the can be modified at any time while the patient is in full mental capacity.   If you don't have one, please consider create one.      More information at: [email or web address]

## 2022-11-04 NOTE — Assessment & Plan Note (Signed)
Here for CPX  Hyperglycemia: Checking A1c U.C.: Due for a colonoscopy, strongly encouraged to call GI as he is planning Hyperlipidemia: Diet controlled, current CV risk 11.3%.  Patient aware he qualifies for statins. Anxiety, depression: Stress at baseline, managing okay.  See next EtOH: Binge drinking, his brother had pancreatitis from EtOH, patient is worried about EtOH, advised there are a number of programs that he could tap when she is ready.  Cerumen impaction: With the patient permission I removed a significant amount of wax from the right ear with a spoon.  Unable to completely clear it.  Recommend peroxide, if not better will make a nurse visit for a lavage RTC 6 months

## 2022-11-04 NOTE — Assessment & Plan Note (Signed)
Here for CPX Tdap 2023. S/p Shingrix Vaccines today: PNM 20 (history of ulcerative colitis) and a flu shot. Vaccines are recommended: RSV, COVID booster. CCS: Per GI, overdue for cscope , plans to reach out to GI Prostate cancer screening: no FH, PSA 07/2021 normal.  Recheck. Labs:  CMP FLP CBC PSA A1c Diet and exercise discussed

## 2022-11-20 ENCOUNTER — Encounter: Payer: Self-pay | Admitting: Internal Medicine

## 2022-11-28 ENCOUNTER — Encounter: Payer: Self-pay | Admitting: Internal Medicine

## 2022-12-02 ENCOUNTER — Other Ambulatory Visit (HOSPITAL_BASED_OUTPATIENT_CLINIC_OR_DEPARTMENT_OTHER): Payer: Self-pay

## 2022-12-02 MED ORDER — COVID-19 MRNA VAC-TRIS(PFIZER) 30 MCG/0.3ML IM SUSY
0.3000 mL | PREFILLED_SYRINGE | Freq: Once | INTRAMUSCULAR | 0 refills | Status: AC
  Filled 2022-12-02: qty 0.3, 1d supply, fill #0

## 2022-12-10 NOTE — Progress Notes (Signed)
Order(s) created erroneously. Erroneous order ID: [id number]  Order moved by: Leonia Corona  Order move date/time: 12/10/2022 5:04 PM  Source Patient: [id number]  Source Contact: 11/28/2022  Destination Patient: [id number]  Destination Contact: 10/01/2022

## 2023-03-24 ENCOUNTER — Telehealth: Payer: Self-pay

## 2023-03-24 ENCOUNTER — Other Ambulatory Visit: Payer: Self-pay | Admitting: Internal Medicine

## 2023-03-24 DIAGNOSIS — F329 Major depressive disorder, single episode, unspecified: Secondary | ICD-10-CM

## 2023-03-24 NOTE — Telephone Encounter (Signed)
PA initiated via Covermymeds; KEY: [id number]. Awaiting determination.

## 2023-03-26 NOTE — Telephone Encounter (Signed)
 PA approved.   Approved. Authorization Expiration Date: 03/23/2024

## 2023-05-05 ENCOUNTER — Ambulatory Visit: Payer: BC Managed Care – PPO | Admitting: Internal Medicine

## 2023-05-05 ENCOUNTER — Encounter: Payer: Self-pay | Admitting: Internal Medicine

## 2023-05-05 VITALS — BP 126/80 | HR 56 | Temp 98.0°F | Resp 16 | Ht 67.0 in | Wt 192.0 lb

## 2023-05-05 DIAGNOSIS — E782 Mixed hyperlipidemia: Secondary | ICD-10-CM | POA: Diagnosis not present

## 2023-05-05 DIAGNOSIS — Z1211 Encounter for screening for malignant neoplasm of colon: Secondary | ICD-10-CM | POA: Diagnosis not present

## 2023-05-05 DIAGNOSIS — R7989 Other specified abnormal findings of blood chemistry: Secondary | ICD-10-CM

## 2023-05-05 DIAGNOSIS — F419 Anxiety disorder, unspecified: Secondary | ICD-10-CM | POA: Diagnosis not present

## 2023-05-05 DIAGNOSIS — F32A Depression, unspecified: Secondary | ICD-10-CM

## 2023-05-05 DIAGNOSIS — Z09 Encounter for follow-up examination after completed treatment for conditions other than malignant neoplasm: Secondary | ICD-10-CM

## 2023-05-05 LAB — LIPID PANEL
Cholesterol: 236 mg/dL — ABNORMAL HIGH (ref 0–200)
HDL: 61.4 mg/dL (ref >39.00)
LDL Cholesterol: 133 mg/dL — ABNORMAL HIGH (ref 0–99)
NonHDL: 174.61
Total CHOL/HDL Ratio: 4
Triglycerides: 208 mg/dL — ABNORMAL HIGH (ref 0.0–149.0)
VLDL: 41.6 mg/dL — ABNORMAL HIGH (ref 0.0–40.0)

## 2023-05-05 LAB — FOLATE: Folate: >25.2 ng/mL (ref >5.9)

## 2023-05-05 NOTE — Assessment & Plan Note (Signed)
 Hyperglycemia last A1c 5.9, stable. Hyperlipidemia: Diet controlled, last LDL decreased to 123, trending down.  CV risk still elevated 8.9 %.  Advised patient: Borderline indication for statins, benefits discussed.  Patient request to recheck FLP.  Will do. Abnormal CBC: MCV elevated, B12 normal, check a folic acid. Anxiety depression: Has some stress, his son is in [place] and he is afraid of possible cuts to that program. U.  C.    encouraged to reach out to Dr. Dulce Sellar proceed with a colonoscopy. EtOH: Still binge drinking sometimes (6 beers), again remind him there are programs that could help him. RTC 10-2023 CPX

## 2023-05-05 NOTE — Patient Instructions (Addendum)
 Recommend to reach out to gastroenterology and schedule your colonoscopy  GO TO THE LAB : Get the blood work     Please go to the front desk: Arrange for a physical exam by 10-2023

## 2023-05-05 NOTE — Progress Notes (Signed)
   Subjective:    Patient ID: Brett Fowler, male    DOB: Aug 24, 1960, 63 y.o.   MRN: [id number]  DOS:  05/05/2023 Type of visit - description: f/u  Since the last office visit is doing well. Denies nausea vomiting diarrhea.  No blood in the stools. Reports some stress but overall doing okay. Diet has improved to some extent, eating healthier, still working on portion control  Review of Systems See above   Past Medical History:  Diagnosis Date   Acid reflux    Alcohol abuse    Arthritis    Avascular necrosis of bone of hip, left (HCC)    Depression    Hyperlipidemia    Ulcerative colitis (HCC)    in remission    Past Surgical History:  Procedure Laterality Date   HERNIA REPAIR  2004   TOTAL HIP ARTHROPLASTY Left 04/28/2018   Procedure: LEFT TOTAL HIP ARTHROPLASTY DIRECT ANTERIOR;  Surgeon: Eldred Manges, MD;  Location: [facility] OR;  Service: Orthopedics;  Laterality: Left;   WISDOM TOOTH EXTRACTION      Current Outpatient Medications  Medication Instructions   ascorbic acid (VITAMIN C) 500 mg, Daily   escitalopram (LEXAPRO) 30 mg, Oral, Daily   Garlic 1,000 mg, Daily   glucosamine-chondroitin 500-400 MG tablet 1 tablet, 3 times daily   mercaptopurine (PURINETHOL) 50 mg, Daily   mesalamine (LIALDA) 2.4 g, Daily with breakfast   MILK THISTLE PO Take by mouth.   Multiple Vitamin (MULTIVITAMIN) capsule 1 capsule, Daily   Red Yeast Rice 600 mg, 2 times daily   Turmeric 500 mg, Daily       Objective:   Physical Exam BP 126/80   Pulse (!) 56   Temp 98 F (36.7 C) (Oral)   Resp 16   Ht 5\' 7"  (1.702 m)   Wt 192 lb (87.1 kg)   SpO2 97%   BMI 30.07 kg/m  General:   Well developed, NAD, BMI noted.  HEENT:  Normocephalic . Face symmetric, atraumatic Abdomen:  Not distended, soft, non-tender. No rebound or rigidity.   Skin: Not pale. Not jaundice Lower extremities: no pretibial edema bilaterally  Neurologic:  alert & oriented X3.  Speech normal, gait appropriate for  age and unassisted Psych--  Cognition and judgment appear intact.  Cooperative with normal attention span and concentration.  Behavior appropriate. No anxious or depressed appearing.     Assessment   Assessment (new patient 11-2021, transferring to this office). Hyperglycemia Ulcerative colitis- [facility], Dr Dulce Sellar Hyperlipidemia Anxiety depression B hip Avascular Necrosis L total hip replacement 2020 d/t hip collapse ETOH  PLAN Hyperglycemia last A1c 5.9, stable. Hyperlipidemia: Diet controlled, last LDL decreased to 123, trending down.  CV risk still elevated 8.9 %.  Advised patient: Borderline indication for statins, benefits discussed.  Patient request to recheck FLP.  Will do. Abnormal CBC: MCV elevated, B12 normal, check a folic acid. Anxiety depression: Has some stress, his son is in [place] and he is afraid of possible cuts to that program. U.  C.    encouraged to reach out to Dr. Dulce Sellar proceed with a colonoscopy. EtOH: Still binge drinking sometimes (6 beers), again remind him there are programs that could help him. RTC 10-2023 CPX

## 2023-05-06 ENCOUNTER — Encounter: Payer: Self-pay | Admitting: *Deleted

## 2023-05-06 ENCOUNTER — Other Ambulatory Visit: Payer: Self-pay | Admitting: *Deleted

## 2023-05-06 ENCOUNTER — Encounter: Payer: Self-pay | Admitting: Internal Medicine

## 2023-05-06 DIAGNOSIS — E782 Mixed hyperlipidemia: Secondary | ICD-10-CM

## 2023-05-06 MED ORDER — ATORVASTATIN CALCIUM 20 MG PO TABS: 20.0000 mg | ORAL_TABLET | Freq: Every day | ORAL | 0 refills | Status: DC

## 2023-06-11 ENCOUNTER — Encounter: Payer: Self-pay | Admitting: Internal Medicine

## 2023-06-18 ENCOUNTER — Other Ambulatory Visit

## 2023-06-22 ENCOUNTER — Other Ambulatory Visit: Payer: Self-pay | Admitting: Internal Medicine

## 2023-06-22 DIAGNOSIS — F329 Major depressive disorder, single episode, unspecified: Secondary | ICD-10-CM

## 2023-06-24 ENCOUNTER — Other Ambulatory Visit (INDEPENDENT_AMBULATORY_CARE_PROVIDER_SITE_OTHER)

## 2023-06-24 DIAGNOSIS — E782 Mixed hyperlipidemia: Secondary | ICD-10-CM | POA: Diagnosis not present

## 2023-06-24 LAB — AST: AST: 29 U/L (ref 0–37)

## 2023-06-24 LAB — LIPID PANEL
Cholesterol: 191 mg/dL (ref 0–200)
HDL: 55.8 mg/dL (ref >39.00)
LDL Cholesterol: 82 mg/dL (ref 0–99)
NonHDL: 134.98
Total CHOL/HDL Ratio: 3
Triglycerides: 267 mg/dL — ABNORMAL HIGH (ref 0.0–149.0)
VLDL: 53.4 mg/dL — ABNORMAL HIGH (ref 0.0–40.0)

## 2023-06-24 LAB — ALT: ALT: 32 U/L (ref 0–53)

## 2023-06-25 ENCOUNTER — Encounter: Payer: Self-pay | Admitting: Internal Medicine

## 2023-06-25 MED ORDER — ATORVASTATIN CALCIUM 20 MG PO TABS: 20.0000 mg | ORAL_TABLET | Freq: Every day | ORAL | 3 refills | Status: AC

## 2023-06-25 NOTE — Addendum Note (Signed)
 Addended by: Dennie Moltz D on: 06/25/2023 10:28 AM   Modules accepted: Orders

## 2023-08-18 DIAGNOSIS — K5150 Left sided colitis without complications: Secondary | ICD-10-CM | POA: Diagnosis not present

## 2023-08-26 DIAGNOSIS — H43811 Vitreous degeneration, right eye: Secondary | ICD-10-CM | POA: Diagnosis not present

## 2023-08-26 DIAGNOSIS — H5213 Myopia, bilateral: Secondary | ICD-10-CM | POA: Diagnosis not present

## 2023-09-15 DIAGNOSIS — K5730 Diverticulosis of large intestine without perforation or abscess without bleeding: Secondary | ICD-10-CM | POA: Diagnosis not present

## 2023-09-15 DIAGNOSIS — K5150 Left sided colitis without complications: Secondary | ICD-10-CM | POA: Diagnosis not present

## 2023-09-15 DIAGNOSIS — K5289 Other specified noninfective gastroenteritis and colitis: Secondary | ICD-10-CM | POA: Diagnosis not present

## 2023-09-15 LAB — HM COLONOSCOPY

## 2023-09-21 ENCOUNTER — Other Ambulatory Visit: Payer: Self-pay | Admitting: Internal Medicine

## 2023-09-21 DIAGNOSIS — F329 Major depressive disorder, single episode, unspecified: Secondary | ICD-10-CM

## 2023-10-07 ENCOUNTER — Encounter: Payer: Self-pay | Admitting: Internal Medicine

## 2023-10-09 ENCOUNTER — Encounter: Payer: Self-pay | Admitting: Internal Medicine

## 2023-11-05 ENCOUNTER — Encounter: Admitting: Family Medicine

## 2023-11-09 ENCOUNTER — Encounter: Payer: Self-pay | Admitting: Internal Medicine

## 2023-11-09 ENCOUNTER — Ambulatory Visit: Admitting: Internal Medicine

## 2023-11-09 VITALS — BP 124/80 | HR 55 | Temp 98.0°F | Resp 16 | Ht 67.0 in | Wt 187.4 lb

## 2023-11-09 DIAGNOSIS — E782 Mixed hyperlipidemia: Secondary | ICD-10-CM

## 2023-11-09 DIAGNOSIS — Z0000 Encounter for general adult medical examination without abnormal findings: Secondary | ICD-10-CM

## 2023-11-09 DIAGNOSIS — R739 Hyperglycemia, unspecified: Secondary | ICD-10-CM

## 2023-11-09 DIAGNOSIS — Z1283 Encounter for screening for malignant neoplasm of skin: Secondary | ICD-10-CM

## 2023-11-09 DIAGNOSIS — Z23 Encounter for immunization: Secondary | ICD-10-CM | POA: Diagnosis not present

## 2023-11-09 DIAGNOSIS — Z125 Encounter for screening for malignant neoplasm of prostate: Secondary | ICD-10-CM | POA: Diagnosis not present

## 2023-11-09 LAB — COMPREHENSIVE METABOLIC PANEL WITH GFR
ALT: 42 U/L (ref 0–53)
AST: 34 U/L (ref 0–37)
Albumin: 4.6 g/dL (ref 3.5–5.2)
Alkaline Phosphatase: 52 U/L (ref 39–117)
BUN: 14 mg/dL (ref 6–23)
CO2: 31 meq/L (ref 19–32)
Calcium: 9.3 mg/dL (ref 8.4–10.5)
Chloride: 103 meq/L (ref 96–112)
Creatinine, Ser: 0.84 mg/dL (ref 0.40–1.50)
GFR: 93.07 mL/min (ref >60.00)
Glucose, Bld: 110 mg/dL — ABNORMAL HIGH (ref 70–99)
Potassium: 4.6 meq/L (ref 3.5–5.1)
Sodium: 140 meq/L (ref 135–145)
Total Bilirubin: 0.8 mg/dL (ref 0.2–1.2)
Total Protein: 7 g/dL (ref 6.0–8.3)

## 2023-11-09 LAB — PSA: PSA: 0.52 ng/mL (ref 0.10–4.00)

## 2023-11-09 LAB — CBC WITH DIFFERENTIAL/PLATELET
Basophils Absolute: 0 K/uL (ref 0.0–0.1)
Basophils Relative: 1 % (ref 0.0–3.0)
Eosinophils Absolute: 0.1 K/uL (ref 0.0–0.7)
Eosinophils Relative: 4.3 % (ref 0.0–5.0)
HCT: 44.4 % (ref 39.0–52.0)
Hemoglobin: 15.3 g/dL (ref 13.0–17.0)
Lymphocytes Relative: 28.5 % (ref 12.0–46.0)
Lymphs Abs: 0.8 K/uL (ref 0.7–4.0)
MCHC: 34.4 g/dL (ref 30.0–36.0)
MCV: 104.3 fl — ABNORMAL HIGH (ref 78.0–100.0)
Monocytes Absolute: 0.4 K/uL (ref 0.1–1.0)
Monocytes Relative: 12.8 % — ABNORMAL HIGH (ref 3.0–12.0)
Neutro Abs: 1.6 K/uL (ref 1.4–7.7)
Neutrophils Relative %: 53.4 % (ref 43.0–77.0)
Platelets: 183 K/uL (ref 150.0–400.0)
RBC: 4.26 Mil/uL (ref 4.22–5.81)
RDW: 13.9 % (ref 11.5–15.5)
WBC: 2.9 K/uL — ABNORMAL LOW (ref 4.0–10.5)

## 2023-11-09 LAB — HEMOGLOBIN A1C: Hgb A1c MFr Bld: 6.3 % (ref 4.6–6.5)

## 2023-11-09 NOTE — Assessment & Plan Note (Addendum)
 Here for CPX   Other issues: Hyperglycemia: Checking labs Ulcerative colitis: asx, under the care of GI. Anxiety depression: Controlled on Lexapro  High cholesterol: Last LDL 153, was recommended atorvastatin .  LDL improved.  No change, check LFTs Increase MCV: WNL B12 folic acid .  Due to EtOH? EtOH: Binge drinking, unchanged per patient.  Counseled about completely stopping Dermatology referral: Requested referral for complete checkup, sent RTC 6 months.

## 2023-11-09 NOTE — Assessment & Plan Note (Signed)
 Here for CPX Tdap 2023. S/p Shingrix .  PNM 20 (2024). Vaccines I recommend: Flu shot, COVID booster CCS: C-scope 09/15/2023.  Next per GI Prostate cancer screening: no FH,  no sxs, PSA Labs:CMP CBC A1c PSA Diet and exercise: Encouraged healthy diet with portion control and decrease carbohydrates

## 2023-11-09 NOTE — Patient Instructions (Addendum)
 You got a flu shot today Recommend to proceed with a COVID booster at the pharmacy  Will refer you to [facility] dermatology.  Call them in a couple of days and set up an appointment at your convenience    GO TO THE LAB :  Get the blood work   Your results will be posted on MyChart with my comments  Go to the front desk for the checkout Please make an appointment for a checkup in 6 months

## 2023-11-09 NOTE — Progress Notes (Signed)
 Subjective:    Patient ID: Brett Fowler, male    DOB: 10/29/1960, 63 y.o.   MRN: [id number]  DOS:  11/09/2023 Type of visit - description: CPX  Here for CPX. Feeling well. Has no major concerns. Chronic medical problems addressed as well today.   Review of Systems  A 14 point review of systems is negative    Past Medical History:  Diagnosis Date   Acid reflux    Alcohol abuse    Arthritis    Avascular necrosis of bone of hip, left (HCC)    Depression    Hyperlipidemia    Ulcerative colitis (HCC)    in remission    Past Surgical History:  Procedure Laterality Date   HERNIA REPAIR  2004   TOTAL HIP ARTHROPLASTY Left 04/28/2018   Procedure: LEFT TOTAL HIP ARTHROPLASTY DIRECT ANTERIOR;  Surgeon: Barbarann Oneil BROCKS, MD;  Location: [facility] OR;  Service: Orthopedics;  Laterality: Left;   WISDOM TOOTH EXTRACTION     Social History   Socioeconomic History   Marital status: Married    Spouse name: Not on file   Number of children: 1   Years of education: Not on file   Highest education level: Not on file  Occupational History   Occupation: Teaching laboratory technician  Tobacco Use   Smoking status: Never   Smokeless tobacco: Never  Vaping Use   Vaping status: Never Used  Substance and Sexual Activity   Alcohol use: Yes    Alcohol/week: 30.0 standard drinks of alcohol    Types: 30 Cans of beer per week    Comment: 4-5 days a week, heavy at times   Drug use: No   Sexual activity: Yes  Other Topics Concern   Not on file  Social History Narrative   Household:   Pt, wife and son w/ severe autism   Social Drivers of Corporate investment banker Strain: Not on file  Food Insecurity: Not on file  Transportation Needs: Not on file  Physical Activity: Not on file  Stress: Not on file  Social Connections: Not on file  Intimate Partner Violence: Not on file    Current Outpatient Medications  Medication Instructions   ascorbic acid (VITAMIN C) 500 mg, Daily   atorvastatin  (LIPITOR)  20 mg, Oral, Daily at bedtime   escitalopram  (LEXAPRO ) 30 mg, Oral, Daily   Garlic 1,000 mg, Daily   glucosamine-chondroitin 500-400 MG tablet 1 tablet, 3 times daily   mercaptopurine (PURINETHOL) 50 mg, Daily   mesalamine (LIALDA) 2.4 g, Daily with breakfast   MILK THISTLE PO Take by mouth.   Multiple Vitamin (MULTIVITAMIN) capsule 1 capsule, Daily   Turmeric 500 mg, Daily       Objective:   Physical Exam BP 124/80   Pulse (!) 55   Temp 98 F (36.7 C) (Oral)   Resp 16   Ht 5' 7 (1.702 m)   Wt 187 lb 6 oz (85 kg)   SpO2 95%   BMI 29.35 kg/m  General: Well developed, NAD, BMI noted Neck: No  thyromegaly  HEENT:  Normocephalic . Face symmetric, atraumatic Lungs:  CTA B Normal respiratory effort, no intercostal retractions, no accessory muscle use. Heart: RRR,  no murmur.  Abdomen:  Not distended, soft, non-tender. No rebound or rigidity.   Lower extremities: no pretibial edema bilaterally  Skin: Exposed areas without rash. Not pale. Not jaundice Neurologic:  alert & oriented X3.  Speech normal, gait appropriate for age and unassisted Strength symmetric  and appropriate for age.  Psych: Cognition and judgment appear intact.  Cooperative with normal attention span and concentration.  Behavior appropriate. No anxious or depressed appearing.     Assessment    ASSESSMENT (new patient 11-2021, transferring to this office). Hyperglycemia Ulcerative colitis- [facility], Dr Burnette Hyperlipidemia Anxiety depression B hip Avascular Necrosis L total hip replacement 2020 d/t hip collapse ETOH  PLAN Here for CPX Tdap 2023. S/p Shingrix .  PNM 20 (2024). Vaccines I recommend: Flu shot, COVID booster CCS: C-scope 09/15/2023.  Next per GI Prostate cancer screening: no FH,  no sxs, PSA Labs:CMP CBC A1c PSA Diet and exercise: Encouraged healthy diet with portion control and decrease carbohydrates  Other issues: Hyperglycemia: Checking labs Ulcerative colitis: asx, under  the care of GI. Anxiety depression: Controlled on Lexapro  High cholesterol: Last LDL 153, was recommended atorvastatin .  LDL improved.  No change, check LFTs Increase MCV: WNL B12 folic acid .  Due to EtOH? EtOH: Binge drinking, unchanged per patient.  Counseled about completely stopping Dermatology referral: Requested referral for complete checkup, sent RTC 6 months.

## 2023-11-10 ENCOUNTER — Ambulatory Visit: Payer: Self-pay | Admitting: Internal Medicine

## 2023-11-10 DIAGNOSIS — D7589 Other specified diseases of blood and blood-forming organs: Secondary | ICD-10-CM

## 2023-11-10 DIAGNOSIS — D72819 Decreased white blood cell count, unspecified: Secondary | ICD-10-CM

## 2023-11-21 ENCOUNTER — Encounter: Payer: Self-pay | Admitting: Internal Medicine

## 2023-11-24 ENCOUNTER — Inpatient Hospital Stay: Admitting: Family

## 2023-11-24 ENCOUNTER — Inpatient Hospital Stay

## 2023-11-30 ENCOUNTER — Other Ambulatory Visit: Payer: Self-pay | Admitting: Family

## 2023-11-30 DIAGNOSIS — D72819 Decreased white blood cell count, unspecified: Secondary | ICD-10-CM

## 2023-12-01 ENCOUNTER — Inpatient Hospital Stay (HOSPITAL_BASED_OUTPATIENT_CLINIC_OR_DEPARTMENT_OTHER): Admitting: Family

## 2023-12-01 ENCOUNTER — Inpatient Hospital Stay: Attending: Hematology & Oncology

## 2023-12-01 ENCOUNTER — Encounter: Payer: Self-pay | Admitting: Family

## 2023-12-01 VITALS — BP 130/90 | HR 52 | Temp 98.0°F | Resp 18 | Ht 67.0 in | Wt 190.0 lb

## 2023-12-01 DIAGNOSIS — F32A Depression, unspecified: Secondary | ICD-10-CM | POA: Insufficient documentation

## 2023-12-01 DIAGNOSIS — D72819 Decreased white blood cell count, unspecified: Secondary | ICD-10-CM | POA: Insufficient documentation

## 2023-12-01 DIAGNOSIS — Z79899 Other long term (current) drug therapy: Secondary | ICD-10-CM | POA: Diagnosis not present

## 2023-12-01 DIAGNOSIS — E785 Hyperlipidemia, unspecified: Secondary | ICD-10-CM | POA: Insufficient documentation

## 2023-12-01 DIAGNOSIS — Z803 Family history of malignant neoplasm of breast: Secondary | ICD-10-CM | POA: Insufficient documentation

## 2023-12-01 DIAGNOSIS — Z818 Family history of other mental and behavioral disorders: Secondary | ICD-10-CM | POA: Diagnosis not present

## 2023-12-01 LAB — CMP (CANCER CENTER ONLY)
ALT: 43 U/L (ref 0–44)
AST: 37 U/L (ref 15–41)
Albumin: 4.5 g/dL (ref 3.5–5.0)
Alkaline Phosphatase: 63 U/L (ref 38–126)
Anion gap: 9 (ref 5–15)
BUN: 11 mg/dL (ref 8–23)
CO2: 27 mmol/L (ref 22–32)
Calcium: 9.5 mg/dL (ref 8.9–10.3)
Chloride: 104 mmol/L (ref 98–111)
Creatinine: 0.84 mg/dL (ref 0.61–1.24)
GFR, Estimated: >60 mL/min (ref >60)
Glucose, Bld: 119 mg/dL — ABNORMAL HIGH (ref 70–99)
Potassium: 5 mmol/L (ref 3.5–5.1)
Sodium: 140 mmol/L (ref 135–145)
Total Bilirubin: 0.4 mg/dL (ref 0.0–1.2)
Total Protein: 7.2 g/dL (ref 6.5–8.1)

## 2023-12-01 LAB — CBC WITH DIFFERENTIAL (CANCER CENTER ONLY)
Abs Immature Granulocytes: 0.01 K/uL (ref 0.00–0.07)
Basophils Absolute: 0.1 K/uL (ref 0.0–0.1)
Basophils Relative: 1 %
Eosinophils Absolute: 0.1 K/uL (ref 0.0–0.5)
Eosinophils Relative: 3 %
HCT: 41.8 % (ref 39.0–52.0)
Hemoglobin: 14.8 g/dL (ref 13.0–17.0)
Immature Granulocytes: 0 %
Lymphocytes Relative: 32 %
Lymphs Abs: 1.3 K/uL (ref 0.7–4.0)
MCH: 35.8 pg — ABNORMAL HIGH (ref 26.0–34.0)
MCHC: 35.4 g/dL (ref 30.0–36.0)
MCV: 101.2 fL — ABNORMAL HIGH (ref 80.0–100.0)
Monocytes Absolute: 0.5 K/uL (ref 0.1–1.0)
Monocytes Relative: 13 %
Neutro Abs: 2.2 K/uL (ref 1.7–7.7)
Neutrophils Relative %: 51 %
Platelet Count: 210 K/uL (ref 150–400)
RBC: 4.13 MIL/uL — ABNORMAL LOW (ref 4.22–5.81)
RDW: 12.8 % (ref 11.5–15.5)
WBC Count: 4.3 K/uL (ref 4.0–10.5)
nRBC: 0 % (ref 0.0–0.2)

## 2023-12-01 LAB — SAVE SMEAR(SSMR), FOR PROVIDER SLIDE REVIEW

## 2023-12-01 LAB — LACTATE DEHYDROGENASE: LDH: 199 U/L — ABNORMAL HIGH (ref 98–192)

## 2023-12-01 NOTE — Progress Notes (Signed)
 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  WBC count today has returned to normal and white cell differential is unremarkable.  He denies any issue with frequent or recurrent infections.  No fever, chills, n/v, cough, rash, dizziness, SOB, chest pain, palpitations, abdominal pain or changes in bowel or bladder habits.  No known liver or spleen issues. Spleen is still in.  Historically his B 12 and folate have been stable.  He has history of ulcerative colitis and is currently on Lialda as well as Purinethol which is likely the cause of his mild intermittent leukopenia.  He has not noted any blood loss. No abnormal bruising, no petechiae.  He is under good amount of stress at home caring for his non verbal, autistic son as well as working in a Furniture conservator/restorer.  He has 3-4 beers most nights. No liquor. No smoking or recreational drug use.  His colonoscopy was done in July 2025 and showed diverticulosis in the sigmoid and descending colon.  PSA last month was stable at 0.52.  Hgb A1c was 6.3 at recent check. No history of diabetes or thyroid  disease.  No personal history of cancer. Mother had breast cancer with bony metastasis.  He has history of AVN in his hips. He states that his brother also has and that this is felt to be genetic. He asymptomatic with this at this time. He states that so far he has not experienced any pain.  No swell, numbness or tingling in his extremities.  No falls or syncope.  Appetite and hydration are good. Weight is described as stable  at 190 lbs.   ROS: All other 10 point review of systems is negative.   PAST MEDICAL HISTORY:   Past Medical History:  Diagnosis Date   Acid reflux    Alcohol abuse    Arthritis    Avascular necrosis of bone of hip, left (HCC)    Depression    Hyperlipidemia    Ulcerative colitis (HCC)    in remission    ALLERGIES: No Known Allergies    MEDICATIONS:  Current Outpatient Medications on File Prior to Visit  Medication Sig Dispense Refill   escitalopram  (LEXAPRO ) 20 MG tablet Take 1.5 tablets (30 mg total) by mouth daily. 135 tablet 0   Garlic 1000 MG CAPS Take 1,000 mg by mouth daily.      glucosamine-chondroitin 500-400 MG tablet Take 1 tablet by mouth 3 (three) times daily.     mercaptopurine (PURINETHOL) 50 MG tablet Take 50 mg by mouth daily.   10   mesalamine (LIALDA) 1.2 g EC tablet Take 2.4 g by mouth daily with breakfast.   8   MILK THISTLE PO Take by mouth.     Multiple Vitamin (MULTIVITAMIN) capsule Take 1 capsule by mouth daily.     Turmeric 500 MG TABS Take 500 mg by mouth daily.     vitamin C (ASCORBIC ACID) 500 MG tablet Take 500 mg by mouth daily.     atorvastatin  (LIPITOR) 20 MG tablet Take 1 tablet (20 mg  total) by mouth at bedtime. (Patient not taking: Reported on 12/01/2023) 90 tablet 3   No current facility-administered medications on file prior to visit.     PAST SURGICAL HISTORY Past Surgical History:  Procedure Laterality Date   HERNIA REPAIR  2004   TOTAL HIP ARTHROPLASTY Left 04/28/2018   Procedure: LEFT TOTAL HIP ARTHROPLASTY DIRECT ANTERIOR;  Surgeon: Barbarann Oneil BROCKS, MD;  Location: [facility] OR;  Service: Orthopedics;  Laterality: Left;   WISDOM TOOTH EXTRACTION      FAMILY HISTORY: Family History  Problem Relation Age of Onset   Breast cancer Mother        breast   Healthy Father    Colon cancer Neg Hx    Prostate cancer Neg Hx    CAD Neg Hx    Diabetes Neg Hx     SOCIAL HISTORY:  reports that he has never smoked. He has never used smokeless  tobacco. He reports current alcohol use of about 30.0 standard drinks of alcohol per week. He reports that he does not use drugs.  PERFORMANCE STATUS: The patient's performance status is 0 - Asymptomatic  PHYSICAL EXAM: Most Recent Vital Signs: There were no vitals taken for this visit. BP (!) 130/90 (BP Location: Right Arm, Patient Position: Sitting)   Pulse (!) 52   Temp 98 F (36.7 C) (Oral)   Resp 18   Ht 5' 7 (1.702 m)   Wt 190 lb 0.6 oz (86.2 kg)   SpO2 100%   BMI 29.76 kg/m   General Appearance:    Alert, cooperative, no distress, appears stated age  Head:    Normocephalic, without obvious abnormality, atraumatic  Eyes:    PERRL, conjunctiva/corneas clear, EOM's intact, fundi    benign, both eyes             Throat:   Lips, mucosa, and tongue normal; teeth and gums normal  Neck:   Supple, symmetrical, trachea midline, no adenopathy;       thyroid :  No enlargement/tenderness/nodules; no carotid   bruit or JVD  Back:     Symmetric, no curvature, ROM normal, no CVA tenderness  Lungs:     Clear to auscultation bilaterally, respirations unlabored  Chest wall:    No tenderness or deformity  Heart:    Regular rate and rhythm, S1 and S2 normal, no murmur, rub   or gallop  Abdomen:     Soft, non-tender, bowel sounds active all four quadrants,    no masses, no organomegaly        Extremities:   Extremities normal, atraumatic, no cyanosis or edema  Pulses:   2+ and symmetric all extremities  Skin:   Skin color, texture, turgor normal, no rashes or lesions  Lymph nodes:   Cervical, supraclavicular, and axillary nodes normal  Neurologic:   CNII-XII intact. Normal strength, sensation and reflexes      throughout    LABORATORY DATA:  No results found for this or any previous visit (from the past 48 hours).    RADIOGRAPHY: No results found.     PATHOLOGY: None  ASSESSMENT/PLAN: Brett Fowler is a pleasant 63 yo caucasian gentleman with intermittent mild leukopenia and in  creased MCV over the last 6 years or so.  CBC with diff and blood smear both reviewed with Dr. Timmy in detail. NO abnormality or evidence of malignancy noted on blood smear.  This mild intermittent leukopenia is felt to be associated with his medication Putinethol. This is know to have a  mild myelosuppressive effect.  He has remained asymptomatic with this.  No intervention indicated and no follow-up needed at this time.   All questions were answered. The patient knows to call the clinic with any problems, questions or concerns. We can certainly see the patient again for any future hem/onc issues that may arise.   The patient was discussed with Dr. Timmy and he is in agreement with the aforementioned.   Lauraine Pepper, NP

## 2023-12-21 ENCOUNTER — Ambulatory Visit: Admission: EM | Admit: 2023-12-21 | Discharge: 2023-12-21 | Disposition: A | Discharge status: Home / Self Care

## 2023-12-21 ENCOUNTER — Encounter: Payer: Self-pay | Admitting: Emergency Medicine

## 2023-12-21 DIAGNOSIS — K5130 Ulcerative (chronic) rectosigmoiditis without complications: Secondary | ICD-10-CM | POA: Insufficient documentation

## 2023-12-21 DIAGNOSIS — R7989 Other specified abnormal findings of blood chemistry: Secondary | ICD-10-CM | POA: Insufficient documentation

## 2023-12-21 DIAGNOSIS — H6123 Impacted cerumen, bilateral: Secondary | ICD-10-CM

## 2023-12-21 DIAGNOSIS — K5150 Left sided colitis without complications: Secondary | ICD-10-CM | POA: Insufficient documentation

## 2023-12-21 DIAGNOSIS — K219 Gastro-esophageal reflux disease without esophagitis: Secondary | ICD-10-CM | POA: Insufficient documentation

## 2023-12-21 DIAGNOSIS — K5120 Ulcerative (chronic) proctitis without complications: Secondary | ICD-10-CM | POA: Insufficient documentation

## 2023-12-21 MED ORDER — CARBAMIDE PEROXIDE 6.5 % OT SOLN: 5.0000 [drp] | Freq: Once | OTIC | Status: DC

## 2023-12-21 NOTE — Discharge Instructions (Addendum)
  1. Bilateral impacted cerumen (Primary) - Ear wax removal completed in UC, no retained cerumen noted on reexamination, no secondary infection. - carbamide peroxide (DEBROX) 6.5 % OTIC (EAR) solution 5 drop administered into the left ear to soften cerumen for easier removal. -Continue to monitor symptoms for any change in severity if there is any escalation of current symptoms or development of new symptoms follow-up in ER for further evaluation and management.

## 2023-12-21 NOTE — ED Provider Notes (Signed)
 [facility]  Note:  This document was prepared using Conservation officer, historic buildings and may include unintentional dictation errors.  MRN: [id number] DOB: November 10, 1960  Subjective:   Brett Fowler is a 63 y.o. male presenting for right ear fullness and pressure since yesterday.  Patient denies any pain, fever, drainage from ears.  Patient does have some difficulty hearing out of the right ear and reports occasional ringing in the ears.  Patient has tried washing ears with hydrogen peroxide and olive oil with minimal improvement to symptoms.  Patient reports past history of cerumen impaction.   Current Facility-Administered Medications:    carbamide peroxide (DEBROX) 6.5 % OTIC (EAR) solution 5 drop, 5 drop, Left EAR, Once, Roshawnda Pecora B, NP  Current Outpatient Medications:    atorvastatin  (LIPITOR) 20 MG tablet, Take 1 tablet (20 mg total) by mouth at bedtime., Disp: 90 tablet, Rfl: 3   Coenzyme Q10 (COQ10) 50 MG CAPS, Take 1 capsule by mouth daily., Disp: , Rfl:    escitalopram  (LEXAPRO ) 20 MG tablet, Take 1.5 tablets (30 mg total) by mouth daily., Disp: 135 tablet, Rfl: 0   Garlic 1000 MG CAPS, Take 1,000 mg by mouth daily. , Disp: , Rfl:    Glucosamine-Chondroit-Vit C-Mn (GLUCOSAMINE 1500 COMPLEX) CAPS, 1 capsule., Disp: , Rfl:    Krill Oil (OMEGA-3) 500 MG CAPS, Take 1 capsule by mouth daily., Disp: , Rfl:    mercaptopurine (PURINETHOL) 50 MG tablet, Take 50 mg by mouth daily. , Disp: , Rfl: 10   mesalamine (LIALDA) 1.2 g EC tablet, Take 2.4 g by mouth daily with breakfast. , Disp: , Rfl: 8   MILK THISTLE PO, Take by mouth., Disp: , Rfl:    Multiple Vitamin (MULTIVITAMIN) capsule, Take 1 capsule by mouth daily., Disp: , Rfl:    Red Yeast Rice 600 MG TABS, Take 1 tablet by mouth in the morning, at noon, in the evening, and at bedtime. (Patient taking differently: Take 1 tablet by mouth in the morning, at noon, in the evening, and at bedtime.), Disp: , Rfl:    Turmeric  500 MG TABS, Take 500 mg by mouth daily., Disp: , Rfl:    vitamin C (ASCORBIC ACID) 500 MG tablet, Take 500 mg by mouth daily., Disp: , Rfl:    glucosamine-chondroitin 500-400 MG tablet, Take 1 tablet by mouth 3 (three) times daily. (Patient not taking: Reported on 12/21/2023), Disp: , Rfl:    zinc gluconate 50 MG tablet, Take 50 mg by mouth every other day. 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   Procedure: LEFT TOTAL HIP ARTHROPLASTY DIRECT ANTERIOR;  Surgeon: Barbarann Oneil BROCKS, MD;  Location: [facility] OR;  Service: Orthopedics;  Laterality: Left;   WISDOM TOOTH EXTRACTION      Family History  Problem Relation Age of Onset   Breast cancer Mother        breast   Healthy Father    Colon cancer Neg Hx    Prostate cancer Neg Hx    CAD Neg  Hx    Diabetes Neg Hx     Social History   Tobacco Use   Smoking status: Never   Smokeless tobacco: Never  Vaping Use   Vaping status: Never Used  Substance Use Topics   Alcohol use: Yes    Alcohol/week: 30.0 standard drinks of alcohol    Types: 30 Cans of beer per week    Comment: 4-5 days a week, heavy at times   Drug use: No    ROS Refer to HPI for ROS details.  Objective:   Vitals: BP (!) 143/90 (BP Location: Left Arm)   Pulse (!) 58   Temp 98.2 F (36.8 C) (Oral)   Resp 16   SpO2 96%   Physical Exam Vitals and nursing note reviewed.  Constitutional:      General: He is not in acute distress.    Appearance: Normal appearance. He is well-developed. He is not ill-appearing or toxic-appearing.  HENT:     Head: Normocephalic.     Right Ear: Ear canal and external  ear normal. There is impacted cerumen.     Left Ear: Ear canal and external ear normal. There is impacted cerumen.     Nose: Nose normal.     Mouth/Throat:     Mouth: Mucous membranes are moist.     Pharynx: Oropharynx is clear.  Cardiovascular:     Rate and Rhythm: Normal rate.  Pulmonary:     Effort: Pulmonary effort is normal. No respiratory distress.  Skin:    General: Skin is warm and dry.  Neurological:     General: No focal deficit present.     Mental Status: He is alert and oriented to person, place, and time.  Psychiatric:        Mood and Affect: Mood normal.        Behavior: Behavior normal.     Procedures  No results found for this or any previous visit (from the past 24 hours).  No results found.   Assessment and Plan :     Discharge Instructions       1. Bilateral impacted cerumen (Primary) - Ear wax removal completed in UC, no retained cerumen noted on reexamination, no secondary infection. - carbamide peroxide (DEBROX) 6.5 % OTIC (EAR) solution 5 drop administered into the left ear to soften cerumen for easier removal. -Continue to monitor symptoms for any change in severity if there is any escalation of current symptoms or development of new symptoms follow-up in ER for further evaluation and management.       Imanol Bihl B Dahlton Hinde   Atiya Yera, [place] B, NP 12/21/23 1017

## 2023-12-21 NOTE — ED Triage Notes (Signed)
 Pt reports R ear fullness that started yesterday. Denies pain. Notes ringing in the ears and trouble hearing out of it. Has tried hydrogen peroxide and olive oil in ear with little relief.

## 2023-12-22 ENCOUNTER — Other Ambulatory Visit: Payer: Self-pay | Admitting: Internal Medicine

## 2023-12-22 DIAGNOSIS — F329 Major depressive disorder, single episode, unspecified: Secondary | ICD-10-CM

## 2024-03-22 ENCOUNTER — Other Ambulatory Visit: Payer: Self-pay | Admitting: Internal Medicine

## 2024-03-22 DIAGNOSIS — F329 Major depressive disorder, single episode, unspecified: Secondary | ICD-10-CM

## 2024-05-09 ENCOUNTER — Ambulatory Visit: Admitting: Internal Medicine
